# Patient Record
Sex: Female | Born: 1960 | Race: White | Hispanic: No | Marital: Married | Smoking: Never smoker
Health system: Southern US, Community
[De-identification: ages and names within clinical notes are randomized; demographics above are authoritative.]

## PROBLEM LIST (undated history)

## (undated) DIAGNOSIS — G2581 Restless legs syndrome: Secondary | ICD-10-CM

## (undated) DIAGNOSIS — E7800 Pure hypercholesterolemia, unspecified: Secondary | ICD-10-CM

## (undated) DIAGNOSIS — F419 Anxiety disorder, unspecified: Secondary | ICD-10-CM

## (undated) DIAGNOSIS — D649 Anemia, unspecified: Secondary | ICD-10-CM

## (undated) DIAGNOSIS — G4730 Sleep apnea, unspecified: Secondary | ICD-10-CM

## (undated) DIAGNOSIS — J45909 Unspecified asthma, uncomplicated: Secondary | ICD-10-CM

## (undated) DIAGNOSIS — G43909 Migraine, unspecified, not intractable, without status migrainosus: Secondary | ICD-10-CM

## (undated) DIAGNOSIS — K219 Gastro-esophageal reflux disease without esophagitis: Secondary | ICD-10-CM

## (undated) DIAGNOSIS — R251 Tremor, unspecified: Secondary | ICD-10-CM

## (undated) DIAGNOSIS — E079 Disorder of thyroid, unspecified: Secondary | ICD-10-CM

## (undated) DIAGNOSIS — G709 Myoneural disorder, unspecified: Secondary | ICD-10-CM

## (undated) DIAGNOSIS — N812 Incomplete uterovaginal prolapse: Secondary | ICD-10-CM

## (undated) DIAGNOSIS — E663 Overweight: Secondary | ICD-10-CM

## (undated) DIAGNOSIS — H9190 Unspecified hearing loss, unspecified ear: Secondary | ICD-10-CM

## (undated) DIAGNOSIS — F329 Major depressive disorder, single episode, unspecified: Secondary | ICD-10-CM

## (undated) DIAGNOSIS — F32A Depression, unspecified: Secondary | ICD-10-CM

## (undated) HISTORY — PX: WISDOM TOOTH EXTRACTION: SHX21

## (undated) HISTORY — DX: Overweight: E66.3

## (undated) HISTORY — DX: Unspecified hearing loss, unspecified ear: H91.90

## (undated) HISTORY — DX: Anxiety disorder, unspecified: F41.9

## (undated) HISTORY — DX: Sleep apnea, unspecified: G47.30

## (undated) HISTORY — DX: Migraine, unspecified, not intractable, without status migrainosus: G43.909

## (undated) HISTORY — PX: MOUTH SURGERY: SHX715

## (undated) HISTORY — DX: Anemia, unspecified: D64.9

## (undated) HISTORY — DX: Pure hypercholesterolemia, unspecified: E78.00

## (undated) HISTORY — DX: Restless legs syndrome: G25.81

## (undated) HISTORY — DX: Incomplete uterovaginal prolapse: N81.2

## (undated) HISTORY — PX: OTHER SURGICAL HISTORY: SHX169

## (undated) HISTORY — DX: Myoneural disorder, unspecified: G70.9

## (undated) HISTORY — PX: BREAST BIOPSY: SHX20

## (undated) HISTORY — DX: Gastro-esophageal reflux disease without esophagitis: K21.9

## (undated) HISTORY — DX: Depression, unspecified: F32.A

## (undated) HISTORY — PX: ROOT CANAL: SHX2363

## (undated) HISTORY — DX: Tremor, unspecified: R25.1

## (undated) HISTORY — DX: Disorder of thyroid, unspecified: E07.9

## (undated) HISTORY — DX: Major depressive disorder, single episode, unspecified: F32.9

## (undated) HISTORY — DX: Unspecified asthma, uncomplicated: J45.909

---

## 1990-02-24 DIAGNOSIS — Z5189 Encounter for other specified aftercare: Secondary | ICD-10-CM

## 1990-02-24 HISTORY — DX: Encounter for other specified aftercare: Z51.89

## 1990-02-24 HISTORY — PX: DILATION AND CURETTAGE OF UTERUS: SHX78

## 2011-05-06 DIAGNOSIS — N814 Uterovaginal prolapse, unspecified: Secondary | ICD-10-CM | POA: Insufficient documentation

## 2011-05-09 LAB — HM COLONOSCOPY

## 2011-10-21 DIAGNOSIS — R12 Heartburn: Secondary | ICD-10-CM | POA: Insufficient documentation

## 2012-04-29 DIAGNOSIS — Z9109 Other allergy status, other than to drugs and biological substances: Secondary | ICD-10-CM | POA: Insufficient documentation

## 2012-09-09 DIAGNOSIS — R251 Tremor, unspecified: Secondary | ICD-10-CM | POA: Insufficient documentation

## 2013-02-24 HISTORY — PX: COLONOSCOPY: SHX174

## 2013-07-29 DIAGNOSIS — H9190 Unspecified hearing loss, unspecified ear: Secondary | ICD-10-CM | POA: Insufficient documentation

## 2015-06-07 DIAGNOSIS — Z1231 Encounter for screening mammogram for malignant neoplasm of breast: Secondary | ICD-10-CM | POA: Diagnosis not present

## 2015-11-19 DIAGNOSIS — H903 Sensorineural hearing loss, bilateral: Secondary | ICD-10-CM | POA: Diagnosis not present

## 2016-01-04 DIAGNOSIS — L259 Unspecified contact dermatitis, unspecified cause: Secondary | ICD-10-CM | POA: Diagnosis not present

## 2016-01-29 DIAGNOSIS — G250 Essential tremor: Secondary | ICD-10-CM | POA: Diagnosis not present

## 2016-01-29 DIAGNOSIS — G4733 Obstructive sleep apnea (adult) (pediatric): Secondary | ICD-10-CM | POA: Diagnosis not present

## 2016-03-05 DIAGNOSIS — G4733 Obstructive sleep apnea (adult) (pediatric): Secondary | ICD-10-CM | POA: Diagnosis not present

## 2016-03-24 DIAGNOSIS — R251 Tremor, unspecified: Secondary | ICD-10-CM | POA: Diagnosis not present

## 2016-11-12 ENCOUNTER — Encounter: Payer: Self-pay | Admitting: Internal Medicine

## 2016-11-12 ENCOUNTER — Ambulatory Visit (INDEPENDENT_AMBULATORY_CARE_PROVIDER_SITE_OTHER): Payer: BLUE CROSS/BLUE SHIELD | Admitting: Internal Medicine

## 2016-11-12 VITALS — BP 120/80 | HR 65 | Temp 98.4°F | Resp 18 | Ht 63.39 in | Wt 143.2 lb

## 2016-11-12 DIAGNOSIS — G4730 Sleep apnea, unspecified: Secondary | ICD-10-CM | POA: Diagnosis not present

## 2016-11-12 DIAGNOSIS — F419 Anxiety disorder, unspecified: Secondary | ICD-10-CM | POA: Diagnosis not present

## 2016-11-12 DIAGNOSIS — D509 Iron deficiency anemia, unspecified: Secondary | ICD-10-CM | POA: Diagnosis not present

## 2016-11-12 DIAGNOSIS — K219 Gastro-esophageal reflux disease without esophagitis: Secondary | ICD-10-CM | POA: Diagnosis not present

## 2016-11-12 DIAGNOSIS — F329 Major depressive disorder, single episode, unspecified: Secondary | ICD-10-CM

## 2016-11-12 DIAGNOSIS — G250 Essential tremor: Secondary | ICD-10-CM

## 2016-11-12 DIAGNOSIS — E782 Mixed hyperlipidemia: Secondary | ICD-10-CM

## 2016-11-12 DIAGNOSIS — Z79899 Other long term (current) drug therapy: Secondary | ICD-10-CM | POA: Diagnosis not present

## 2016-11-12 DIAGNOSIS — F32A Anxiety disorder, unspecified: Secondary | ICD-10-CM

## 2016-11-12 LAB — COMPLETE METABOLIC PANEL WITH GFR
AG RATIO: 2.4 (calc) (ref 1.0–2.5)
ALT: 15 U/L (ref 6–29)
AST: 16 U/L (ref 10–35)
Albumin: 4.3 g/dL (ref 3.6–5.1)
Alkaline phosphatase (APISO): 88 U/L (ref 33–130)
BUN: 12 mg/dL (ref 7–25)
CALCIUM: 8.9 mg/dL (ref 8.6–10.4)
CO2: 28 mmol/L (ref 20–32)
Chloride: 107 mmol/L (ref 98–110)
Creat: 0.98 mg/dL (ref 0.50–1.05)
GFR, EST NON AFRICAN AMERICAN: 65 mL/min/{1.73_m2} (ref >=60)
GFR, Est African American: 75 mL/min/{1.73_m2} (ref >=60)
GLUCOSE: 89 mg/dL (ref 65–99)
Globulin: 1.8 g/dL (calc) — ABNORMAL LOW (ref 1.9–3.7)
POTASSIUM: 4.2 mmol/L (ref 3.5–5.3)
Sodium: 142 mmol/L (ref 135–146)
Total Bilirubin: 0.4 mg/dL (ref 0.2–1.2)
Total Protein: 6.1 g/dL (ref 6.1–8.1)

## 2016-11-12 LAB — CBC WITH DIFFERENTIAL/PLATELET
Basophils Absolute: 71 cells/uL (ref 0–200)
Basophils Relative: 1.4 %
EOS PCT: 4.1 %
Eosinophils Absolute: 209 cells/uL (ref 15–500)
HCT: 36.6 % (ref 35.0–45.0)
Hemoglobin: 12.6 g/dL (ref 11.7–15.5)
Lymphs Abs: 1586 cells/uL (ref 850–3900)
MCH: 31.6 pg (ref 27.0–33.0)
MCHC: 34.4 g/dL (ref 32.0–36.0)
MCV: 91.7 fL (ref 80.0–100.0)
MONOS PCT: 8.2 %
MPV: 11.2 fL (ref 7.5–12.5)
NEUTROS PCT: 55.2 %
Neutro Abs: 2815 cells/uL (ref 1500–7800)
PLATELETS: 157 10*3/uL (ref 140–400)
RBC: 3.99 10*6/uL (ref 3.80–5.10)
RDW: 12.3 % (ref 11.0–15.0)
TOTAL LYMPHOCYTE: 31.1 %
WBC mixed population: 418 cells/uL (ref 200–950)
WBC: 5.1 10*3/uL (ref 3.8–10.8)

## 2016-11-12 LAB — LIPID PANEL
Cholesterol: 216 mg/dL — ABNORMAL HIGH (ref <200)
HDL: 34 mg/dL — AB (ref >50)
LDL Cholesterol (Calc): 137 mg/dL (calc) — ABNORMAL HIGH
NON-HDL CHOLESTEROL (CALC): 182 mg/dL — AB (ref <130)
TRIGLYCERIDES: 314 mg/dL — AB (ref <150)
Total CHOL/HDL Ratio: 6.4 (calc) — ABNORMAL HIGH (ref <5.0)

## 2016-11-12 LAB — TSH: TSH: 4.44 mIU/L

## 2016-11-12 MED ORDER — DULOXETINE HCL 20 MG PO CPEP: 40.0000 mg | ORAL_CAPSULE | Freq: Every day | ORAL | 3 refills | Status: DC

## 2016-11-12 MED ORDER — ALBUTEROL SULFATE HFA 108 (90 BASE) MCG/ACT IN AERS: 1.0000 | INHALATION_SPRAY | Freq: Four times a day (QID) | RESPIRATORY_TRACT | 3 refills | Status: DC | PRN

## 2016-11-12 MED ORDER — PROPRANOLOL HCL 80 MG PO TABS: 80.0000 mg | ORAL_TABLET | Freq: Every day | ORAL | 3 refills | Status: DC

## 2016-11-12 MED ORDER — BUPROPION HCL ER (SR) 150 MG PO TB12: 150.0000 mg | ORAL_TABLET | Freq: Three times a day (TID) | ORAL | 3 refills | Status: DC

## 2016-11-12 MED ORDER — OMEPRAZOLE 20 MG PO CPDR: 20.0000 mg | DELAYED_RELEASE_CAPSULE | Freq: Every day | ORAL | 3 refills | Status: DC

## 2016-11-12 NOTE — Progress Notes (Signed)
Location:  [facility] clinic  Provider:  Dr. Eulas Post  Code Status:  Goals of Care:  Advanced Directives 11/12/2016  Does Patient Have a Medical Advance Directive? No  Would patient like information on creating a medical advance directive? Yes (ED - Information included in AVS)     Chief Complaint  Patient presents with  . Establish Care    HPI: Patient is a 56 y.o. female seen today to establish care. Patient moved to [place] from [place] with her husband. 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 Her husband Advertising account executive at [employer].   She states she had labs a year ago.  CPAP use for control of apnea. She states not issues with use of CPAP  Anxiety and depression - States use of Duloxetine and Bupropion helps control.  Tremor - Benign essential tremor controlled with propranolol.  Iron deficiency anemia - She states blood transfusion in 1992 after the birth of her son due to part of the placenta that was not removed.     Past Medical History:  Diagnosis Date  . Anxiety   . Cervix prolapsed into vagina    Uro-Gyne Exam 2011, 2014  . Depression   . GERD (gastroesophageal reflux disease)   . Hearing loss   . High cholesterol   . Overweight   . Sleep apnea   . Tremor     Past Surgical History:  Procedure Laterality Date  . DILATION AND CURETTAGE OF UTERUS  02/24/1990  . MOUTH SURGERY     3 teeth removed   . ROOT CANAL    . ROOT CANAL     2 times   . VAGINAL DELIVERY     1990  . VAGINAL DELIVERY     1986    Allergies  Allergen Reactions  . Bactrim [Sulfamethoxazole-Trimethoprim] Rash  . Sulfur Rash    Outpatient Encounter Prescriptions as of 11/12/2016  Medication Sig  . albuterol (PROVENTIL HFA;VENTOLIN HFA) 108 (90 Base) MCG/ACT inhaler Inhale into the lungs.  Marland Kitchen aspirin EC 81 MG tablet Take 81 mg by mouth daily.  Marland Kitchen buPROPion (WELLBUTRIN SR) 150 MG 12 hr tablet Take 1 tablet by mouth 3 (three) times daily.  . DULoxetine  (CYMBALTA) 20 MG capsule Take 40 mg by mouth daily.  . ferrous sulfate 325 (65 FE) MG tablet Take 325 mg by mouth. 3-4 nights weekly  . FIBER ADULT GUMMIES PO Take by mouth. Takes a couple times a week especially if iron causes constipation  . omeprazole (PRILOSEC) 20 MG capsule Take 20 mg by mouth daily.  . propranolol (INDERAL) 80 MG tablet Take 80 mg by mouth daily.   No facility-administered encounter medications on file as of 11/12/2016.     Review of Systems:  Review of Systems  Constitutional: Negative for activity change, appetite change, chills, diaphoresis, fatigue, fever and unexpected weight change.  HENT: Positive for hearing loss (Uses Hearing aids bilateral). Negative for congestion, dental problem, drooling, ear discharge, ear pain, facial swelling, mouth sores, nosebleeds, postnasal drip, rhinorrhea, sinus pain, sinus pressure, sneezing, sore throat, tinnitus, trouble swallowing and voice change.   Eyes: Negative for photophobia, pain, discharge, redness, itching and visual disturbance.  Respiratory: Negative for apnea, cough, choking, chest tightness, shortness of breath, wheezing and stridor.   Cardiovascular: Negative for chest pain, palpitations and leg swelling.  Gastrointestinal: Negative for abdominal distention, abdominal pain, anal bleeding, blood in stool, constipation, diarrhea, nausea, rectal pain and vomiting.  Endocrine: Negative for cold intolerance,  heat intolerance, polydipsia, polyphagia and polyuria.  Genitourinary: Negative for decreased urine volume, difficulty urinating, dyspareunia, dysuria, enuresis, flank pain, frequency, genital sores, hematuria, menstrual problem, pelvic pain, urgency, vaginal bleeding, vaginal discharge and vaginal pain.  Musculoskeletal: Negative for arthralgias, back pain, gait problem, joint swelling, myalgias, neck pain and neck stiffness.  Skin: Negative for color change, pallor, rash and wound.  Allergic/Immunologic: Negative for  environmental allergies, food allergies and immunocompromised state.  Neurological: Negative for dizziness, tremors, seizures, syncope, facial asymmetry, speech difficulty, weakness, light-headedness, numbness and headaches.  Hematological: Negative for adenopathy. Does not bruise/bleed easily.  Psychiatric/Behavioral: Negative for agitation, behavioral problems, confusion, decreased concentration, dysphoric mood, hallucinations, self-injury, sleep disturbance and suicidal ideas. The patient is not nervous/anxious and is not hyperactive.     Health Maintenance  Topic Date Due  . Hepatitis C Screening  07-10-1960  . HIV Screening  01/12/1976  . TETANUS/TDAP  01/12/1980  . PAP SMEAR  01/11/1982  . MAMMOGRAM  01/12/2011  . COLONOSCOPY  01/12/2011  . INFLUENZA VACCINE  09/24/2016    Physical Exam: Vitals:   11/12/16 0841  BP: 120/80  Pulse: 65  Resp: 18  Temp: 98.4 F (36.9 C)  TempSrc: Oral  SpO2: 96%  Weight: 143 lb 3.2 oz (65 kg)  Height: 5' 3.39" (1.61 m)   Body mass index is 25.06 kg/m. Physical Exam  Constitutional: She is oriented to person, place, and time. She appears well-developed and well-nourished. No distress.  HENT:  Head: Normocephalic and atraumatic.  Right Ear: External ear normal.  Left Ear: External ear normal.  Nose: Nose normal.  Mouth/Throat: Oropharynx is clear and moist.  Eyes: Pupils are equal, round, and reactive to light. Conjunctivae and EOM are normal. Right eye exhibits no discharge. Left eye exhibits no discharge. No scleral icterus.  Neck: Normal range of motion. Neck supple. No JVD present. No tracheal deviation present. No thyromegaly present.  Cardiovascular: Normal rate, regular rhythm, normal heart sounds and intact distal pulses.  Exam reveals no gallop and no friction rub.   No murmur heard. Pulmonary/Chest: Effort normal and breath sounds normal. No stridor. No respiratory distress. She has no wheezes. She has no rales. She exhibits no  tenderness.  Abdominal: Soft. Bowel sounds are normal. She exhibits no distension and no mass. There is no tenderness. There is no rebound and no guarding.  Musculoskeletal: Normal range of motion. She exhibits no edema, tenderness or deformity.  Lymphadenopathy:    She has no cervical adenopathy.  Neurological: She is alert and oriented to person, place, and time. She has normal reflexes.  Skin: Skin is warm and dry. No rash noted. She is not diaphoretic. No erythema. No pallor.  Psychiatric: She has a normal mood and affect. Her behavior is normal. Judgment and thought content normal.    Labs reviewed: Basic Metabolic Panel: No results for input(s): NA, K, CL, CO2, GLUCOSE, BUN, CREATININE, CALCIUM, MG, PHOS, TSH in the last 8760 hours. Liver Function Tests: No results for input(s): AST, ALT, ALKPHOS, BILITOT, PROT, ALBUMIN in the last 8760 hours. No results for input(s): LIPASE, AMYLASE in the last 8760 hours. No results for input(s): AMMONIA in the last 8760 hours. CBC: No results for input(s): WBC, NEUTROABS, HGB, HCT, MCV, PLT in the last 8760 hours. Lipid Panel: No results for input(s): CHOL, HDL, LDLCALC, TRIG, CHOLHDL, LDLDIRECT in the last 8760 hours. No results found for: HGBA1C  Procedures since last visit: No results found.  Assessment/Plan  Return in 3 months for  follow up of anxiety/depression   Labs/tests ordered:   Next appt:  Visit date not found   Marua Qin C. Dezirea Mccollister, Student AGACNP

## 2016-11-12 NOTE — Progress Notes (Signed)
Patient ID: Dominique [name], female   DOB: 07-12-1960, 56 y.o.   MRN: [id number]    Location:  [facility] Place of Service: OFFICE    Advanced Directive information Does Patient Have a Medical Advance Directive?: No, Would patient like information on creating a medical advance directive?: Yes (ED - Information included in AVS)  Chief Complaint  Patient presents with  . Establish Care    HPI:  56 yo female seen today as a new pt. She relocated from [place] last month. Last labs done > 1 yr ago. Last CPE in last yr. She needs med RF. No concerns today. She is a pediatrician with subspecialty in developmental d/o  Anxiety/depression - mood stable on bupropion SR 468m daily and cymbalta 480mdaily  GERD - stable on omeprazole daily  Tremor - benign essential. Stable on propanolol  Hx Fe deficiency anemia - she req'd blood transfusion in 1992 due to incomplete placental delivery. She has intermittent restless leg that improves when she takes iron tabs.  OSA - stable on CPAP qhs. Last sleep study about 4 yrs ago. She does not recall her settings. She uses HFA prn  Hx prolapsed cervix - needs GYN eval  Past Medical History:  Diagnosis Date  . Anxiety   . Cervix prolapsed into vagina    Uro-Gyne Exam 2011, 2014  . Depression   . GERD (gastroesophageal reflux disease)   . Hearing loss   . High cholesterol   . Overweight   . Sleep apnea   . Tremor     Past Surgical History:  Procedure Laterality Date  . DILATION AND CURETTAGE OF UTERUS  02/24/1990  . MOUTH SURGERY     3 teeth removed   . ROOT CANAL    . ROOT CANAL     2 times   . VAGINAL DELIVERY     1990  . VAGINAL DELIVERY     1986    Patient Care Team: CaGildardo CrankerDO as PCP - General (Internal Medicine)  Social History   Social History  . Marital status: Married    Spouse name: N/A  . Number of children: N/A  . Years of education: N/A   Occupational History  . Not on file.   Social History Main Topics  . Smoking  status: Never Smoker  . Smokeless tobacco: Never Used  . Alcohol use Yes     Comment: 4 drinks per week   . Drug use: No  . Sexual activity: Not on file   Other Topics Concern  . Not on file   Social History Narrative   As of 11/11/16   Diet: Minimal red meat, lactose intolerance       Caffeine: Yes      Married, if yes what year: Yes, 2006      Do you live in a house, apartment, assisted living, condo, trailer, ect: House, 2 stories, 2 persons      Pets: 1 dog      Current/Past profession: Pediatrician       Exercise: A little, walking dog and elliptical          Living Will: No   DNR: No   POA/HPOA: No      Functional Status:   Do you have difficulty bathing or dressing yourself? No   Do you have difficulty preparing food or eating? No   Do you have difficulty managing your medications? No    Do you have difficulty managing your finances? No  Do you have difficulty affording your medications? No         reports that she has never smoked. She has never used smokeless tobacco. She reports that she drinks alcohol. She reports that she does not use drugs.  Family History  Problem Relation Age of Onset  . Hypertension Mother   . High Cholesterol Mother   . Cataracts Mother   . Retinal detachment Mother   . Hypertension Brother   . High Cholesterol Brother   . Depression Daughter   . Menstrual problems Daughter   . ADD / ADHD Son   . Depression Son   . Heart disease Maternal Uncle   . Diabetes Paternal Aunt   . Diabetes Paternal Grandfather   . Arthritis Paternal Grandfather    Family Status  Relation Status  . Mother Alive  . Father Alive  . Sister Alive  . Brother Alive  . Daughter Alive  . Daughter Alive  . Son Alive  . Mat Uncle (Not Specified)  . Ethlyn Daniels (Not Specified)  . PGF (Not Specified)     There is no immunization history on file for this patient.  Allergies  Allergen Reactions  . Bactrim [Sulfamethoxazole-Trimethoprim] Rash  .  Sulfur Rash    Medications: Patient's Medications  New Prescriptions   No medications on file  Previous Medications   ALBUTEROL (PROVENTIL HFA;VENTOLIN HFA) 108 (90 BASE) MCG/ACT INHALER    Inhale into the lungs.   ASPIRIN EC 81 MG TABLET    Take 81 mg by mouth daily.   BUPROPION (WELLBUTRIN SR) 150 MG 12 HR TABLET    Take 1 tablet by mouth 3 (three) times daily.   DULOXETINE (CYMBALTA) 20 MG CAPSULE    Take 40 mg by mouth daily.   FERROUS SULFATE 325 (65 FE) MG TABLET    Take 325 mg by mouth. 3-4 nights weekly   FIBER ADULT GUMMIES PO    Take by mouth. Takes a couple times a week especially if iron causes constipation   OMEPRAZOLE (PRILOSEC) 20 MG CAPSULE    Take 20 mg by mouth daily.   PROPRANOLOL (INDERAL) 80 MG TABLET    Take 80 mg by mouth daily.  Modified Medications   No medications on file  Discontinued Medications   No medications on file    Review of Systems  HENT: Positive for dental problem (needs bridge/implant) and hearing loss (has hearing aids).        Dry mouth  Eyes: Positive for visual disturbance (wears glasses).  Gastrointestinal:       Flatulence; indigestion  Musculoskeletal: Positive for arthralgias.  Skin:       dry  Neurological: Positive for tremors.  Hematological:       Had blood transfusion in 1992 due to placental issue  Psychiatric/Behavioral: Positive for dysphoric mood. The patient is nervous/anxious.   All other systems reviewed and are negative. per new pt packet  Vitals:   11/12/16 0841  BP: 120/80  Pulse: 65  Resp: 18  Temp: 98.4 F (36.9 C)  TempSrc: Oral  SpO2: 96%  Weight: 143 lb 3.2 oz (65 kg)  Height: 5' 3.39" (1.61 m)   Body mass index is 25.06 kg/m.  Physical Exam  Constitutional: She is oriented to person, place, and time. She appears well-developed and well-nourished.  HENT:  Mouth/Throat: Oropharynx is clear and moist. No oropharyngeal exudate.  MMM; no oral thrush  Eyes: Pupils are equal, round, and reactive to  light. No scleral icterus.  Neck: Neck supple. Carotid bruit is not present. No tracheal deviation present. No thyromegaly present.  Cardiovascular: Normal rate, regular rhythm, normal heart sounds and intact distal pulses.  Exam reveals no gallop and no friction rub.   No murmur heard. No LE edema b/l. no calf TTP.   Pulmonary/Chest: Effort normal and breath sounds normal. No stridor. No respiratory distress. She has no wheezes. She has no rales.  Abdominal: Soft. Normal appearance and bowel sounds are normal. She exhibits no distension and no mass. There is no hepatomegaly. There is no tenderness. There is no rigidity, no rebound and no guarding. No hernia.  Lymphadenopathy:    She has no cervical adenopathy.  Neurological: She is alert and oriented to person, place, and time. She displays tremor (resting fine).  Skin: Skin is warm and dry. No rash noted.  Psychiatric: She has a normal mood and affect. Her behavior is normal. Judgment and thought content normal.     Labs reviewed: No results found for any previous visit.    No results found.   Assessment/Plan   ICD-10-CM   1. Anxiety and depression F41.9 buPROPion (WELLBUTRIN SR) 150 MG 12 hr tablet   F32.9 DULoxetine (CYMBALTA) 20 MG capsule    CBC with Differential/Platelets  2. Benign essential tremor G25.0 propranolol (INDERAL) 80 MG tablet    CBC with Differential/Platelets  3. Mixed hyperlipidemia E78.2 CBC with Differential/Platelets    Lipid Panel    TSH  4. Gastroesophageal reflux disease without esophagitis K21.9 omeprazole (PRILOSEC) 20 MG capsule    CBC with Differential/Platelets  5. Iron deficiency anemia, unspecified iron deficiency anemia type D50.9 CBC with Differential/Platelets  6. Sleep apnea, unspecified type G47.30 CBC with Differential/Platelets  7. High risk medication use Z79.899 CMP with eGFR   Continue current medications as ordered  Get old records  will call with lab results  Recommend you  get annual flu shot  Recommend you make GYN appt with local provider Sadie Haber, [place], etc)  Follow up in 3 mos for anxiety/depression   Dereck Agerton S. Perlie Gold  [facility] [address] [place], [place] [address] [phone] Cell (Monday-Friday 8 AM - 5 PM) [phone] After 5 PM and follow prompts

## 2016-11-12 NOTE — Patient Instructions (Signed)
Continue current medications as ordered  will call with lab results  Recommend you get annual flu shot  Recommend you make GYN appt with local provider Dominique [name], [facility], etc)  Follow up in 3 mos for anxiety/depression

## 2017-01-12 ENCOUNTER — Ambulatory Visit: Payer: BLUE CROSS/BLUE SHIELD | Admitting: Nurse Practitioner

## 2017-01-12 ENCOUNTER — Encounter: Payer: Self-pay | Admitting: Nurse Practitioner

## 2017-01-12 VITALS — BP 122/76 | HR 65 | Temp 98.2°F | Resp 17 | Ht 63.0 in | Wt 145.0 lb

## 2017-01-12 DIAGNOSIS — J019 Acute sinusitis, unspecified: Secondary | ICD-10-CM | POA: Diagnosis not present

## 2017-01-12 DIAGNOSIS — Z20828 Contact with and (suspected) exposure to other viral communicable diseases: Secondary | ICD-10-CM

## 2017-01-12 LAB — POCT INFLUENZA A/B
Influenza A, POC: NEGATIVE
Influenza B, POC: NEGATIVE

## 2017-01-12 MED ORDER — BENZONATATE 100 MG PO CAPS
100.0000 mg | ORAL_CAPSULE | Freq: Two times a day (BID) | ORAL | 0 refills | Status: DC | PRN
Start: 2017-01-12 — End: 2017-02-11

## 2017-01-12 NOTE — Progress Notes (Signed)
Careteam: Patient Care Team: Dominique Cranker, DO as PCP - General (Internal Medicine)  Advanced Directive information Does Patient Have a Medical Advance Directive?: No, Would patient like information on creating a medical advance directive?: No - Patient declined  Allergies  Allergen Reactions  . Bactrim [Sulfamethoxazole-Trimethoprim] Rash  . Sulfur Rash    Chief Complaint  Patient presents with  . Acute Visit    Pt is being seen for possible flu. Pt was exposed to co-worker 4 to 5 days ago that was positive for flu.      HPI: Patient is a 56 y.o. female seen in the office today for possible flu.  Influenza swab negative.  Sore throat started 3 days ago and since then has had increase congestion in her head/face and can hear it when she talks.  Chills yesterday. No fever that she is aware of.  Declines body aches.  Occasional cough, non-production.  Nose is draining and cough at night.  Using dayquil which helps.    Review of Systems:  Review of Systems  Constitutional: Positive for chills. Negative for fever and malaise/fatigue.  HENT: Positive for congestion and sore throat. Negative for ear discharge and ear pain.   Respiratory: Positive for cough. Negative for sputum production, shortness of breath and wheezing.   Cardiovascular: Negative for chest pain.  Neurological: Negative for dizziness, weakness and headaches.    Past Medical History:  Diagnosis Date  . Anxiety   . Asthma   . Cervix prolapsed into vagina    Uro-Gyne Exam 2011, 2014  . Depression   . GERD (gastroesophageal reflux disease)   . Hearing loss   . High cholesterol   . Overweight   . Sleep apnea   . Tremor    Past Surgical History:  Procedure Laterality Date  . DILATION AND CURETTAGE OF UTERUS  02/24/1990  . MOUTH SURGERY     3 teeth removed   . ROOT CANAL    . ROOT CANAL     2 times   . VAGINAL DELIVERY     1990  . VAGINAL DELIVERY     1986   Social History:   reports that   has never smoked. she has never used smokeless tobacco. She reports that she drinks alcohol. She reports that she does not use drugs.  Family History  Problem Relation Age of Onset  . Hypertension Mother   . High Cholesterol Mother   . Cataracts Mother   . Retinal detachment Mother   . Hypertension Brother   . High Cholesterol Brother   . Depression Daughter   . Menstrual problems Daughter   . ADD / ADHD Son   . Depression Son   . Heart disease Maternal Uncle   . Diabetes Paternal Aunt   . Diabetes Paternal Grandfather   . Arthritis Paternal Grandfather     Medications:   Medication List        Accurate as of 01/12/17 10:01 AM. Always use your most recent med list.          albuterol 108 (90 Base) MCG/ACT inhaler Commonly known as:  PROVENTIL HFA;VENTOLIN HFA Inhale 1 puff into the lungs every 6 (six) hours as needed for wheezing or shortness of breath.   aspirin EC 81 MG tablet   buPROPion 150 MG 12 hr tablet Commonly known as:  WELLBUTRIN SR Take 1 tablet (150 mg total) by mouth 3 (three) times daily.   DULoxetine 20 MG capsule Commonly known as:  CYMBALTA Take 2 capsules (40 mg total) by mouth daily.   ferrous sulfate 325 (65 FE) MG tablet   FIBER ADULT GUMMIES PO   omeprazole 20 MG capsule Commonly known as:  PRILOSEC Take 1 capsule (20 mg total) by mouth daily.   propranolol 80 MG tablet Commonly known as:  INDERAL Take 1 tablet (80 mg total) by mouth daily.        Physical Exam:  Vitals:   01/12/17 0955  BP: 122/76  Pulse: 65  Resp: 17  Temp: 98.2 F (36.8 C)  TempSrc: Oral  SpO2: 98%  Weight: 145 lb (65.8 kg)  Height: 5\' 3"  (1.6 m)   Body mass index is 25.69 kg/m.  Physical Exam  Constitutional: She is oriented to person, place, and time. She appears well-developed and well-nourished. No distress.  HENT:  Head: Normocephalic and atraumatic.  Nose: Mucosal edema, rhinorrhea and sinus tenderness present.  Mouth/Throat: Posterior  oropharyngeal erythema (no exudate ) present. No oropharyngeal exudate.  Eyes: Conjunctivae are normal. Pupils are equal, round, and reactive to light.  Neck: Normal range of motion. Neck supple.  Cardiovascular: Normal rate, regular rhythm and normal heart sounds.  Pulmonary/Chest: Effort normal and breath sounds normal.  Musculoskeletal: She exhibits no edema or tenderness.  Neurological: She is alert and oriented to person, place, and time. No cranial nerve deficit.  Skin: Skin is warm and dry. She is not diaphoretic.  Psychiatric: She has a normal mood and affect.    Labs reviewed: Basic Metabolic Panel: Recent Labs    11/12/16 1008  NA 142  K 4.2  CL 107  CO2 28  GLUCOSE 89  BUN 12  CREATININE 0.98  CALCIUM 8.9  TSH 4.44   Liver Function Tests: Recent Labs    11/12/16 1008  AST 16  ALT 15  BILITOT 0.4  PROT 6.1   No results for input(s): LIPASE, AMYLASE in the last 8760 hours. No results for input(s): AMMONIA in the last 8760 hours. CBC: Recent Labs    11/12/16 1008  WBC 5.1  NEUTROABS 2,815  HGB 12.6  HCT 36.6  MCV 91.7  PLT 157   Lipid Panel: Recent Labs    11/12/16 1008  CHOL 216*  HDL 34*  TRIG 314*  CHOLHDL 6.4*   TSH: Recent Labs    11/12/16 1008  TSH 4.44   A1C: No results found for: HGBA1C   Assessment/Plan 1. Exposure to the flu -co-worker was positive for influenza last week - POC Influenza A/B- negative  2. Acute non-recurrent sinusitis, unspecified location -viral, supportive care encouraged.  neti pot twice daily Plain nasal saline spray throughout the day as needed May use tylenol 325 mg 2 tablets every 6 hours as needed aches and pains or sore throat humidifier in the home to help with the dry air Mucinex DM by mouth twice daily as needed for cough and congestion with full glass of water  Keep well hydrated Avoid forcefully blowing nose -stay hydrated - benzonatate (TESSALON) 100 MG capsule; Take 1 capsule (100 mg  total) 2 (two) times daily as needed by mouth for cough.  Dispense: 20 capsule; Refill: 0  Return precautions discussed.  Carlos American. Harle Battiest  [facility] [phone] 8 am - 5 pm) [phone] (after hours)

## 2017-01-12 NOTE — Patient Instructions (Addendum)
neti pot twice daily Plain nasal saline spray throughout the day as needed May use tylenol 325 mg 2 tablets every 6 hours as needed aches and pains or sore throat humidifier in the home to help with the dry air Mucinex DM by mouth twice daily as needed for cough and congestion with full glass of water  Keep well hydrated To use benzonatate 100 mg by mouth every 8 hours as needed cough Avoid forcefully blowing nose   Sinusitis, Adult Sinusitis is soreness and inflammation of your sinuses. Sinuses are hollow spaces in the bones around your face. Your sinuses are located:  Around your eyes.  In the middle of your forehead.  Behind your nose.  In your cheekbones.  Your sinuses and nasal passages are lined with a stringy fluid (mucus). Mucus normally drains out of your sinuses. When your nasal tissues become inflamed or swollen, the mucus can become trapped or blocked so air cannot flow through your sinuses. This allows bacteria, viruses, and funguses to grow, which leads to infection. Sinusitis can develop quickly and last for 7?10 days (acute) or for more than 12 weeks (chronic). Sinusitis often develops after a cold. What are the causes? This condition is caused by anything that creates swelling in the sinuses or stops mucus from draining, including:  Allergies.  Asthma.  Bacterial or viral infection.  Abnormally shaped bones between the nasal passages.  Nasal growths that contain mucus (nasal polyps).  Narrow sinus openings.  Pollutants, such as chemicals or irritants in the air.  A foreign object stuck in the nose.  A fungal infection. This is rare.  What increases the risk? The following factors may make you more likely to develop this condition:  Having allergies or asthma.  Having had a recent cold or respiratory tract infection.  Having structural deformities or blockages in your nose or sinuses.  Having a weak immune system.  Doing a lot of swimming or  diving.  Overusing nasal sprays.  Smoking.  What are the signs or symptoms? The main symptoms of this condition are pain and a feeling of pressure around the affected sinuses. Other symptoms include:  Upper toothache.  Earache.  Headache.  Bad breath.  Decreased sense of smell and taste.  A cough that may get worse at night.  Fatigue.  Fever.  Thick drainage from your nose. The drainage is often green and it may contain pus (purulent).  Stuffy nose or congestion.  Postnasal drip. This is when extra mucus collects in the throat or back of the nose.  Swelling and warmth over the affected sinuses.  Sore throat.  Sensitivity to light.  How is this diagnosed? This condition is diagnosed based on symptoms, a medical history, and a physical exam. To find out if your condition is acute or chronic, your health care provider may:  Look in your nose for signs of nasal polyps.  Tap over the affected sinus to check for signs of infection.  View the inside of your sinuses using an imaging device that has a light attached (endoscope).  If your health care provider suspects that you have chronic sinusitis, you may also:  Be tested for allergies.  Have a sample of mucus taken from your nose (nasal culture) and checked for bacteria.  Have a mucus sample examined to see if your sinusitis is related to an allergy.  If your sinusitis does not respond to treatment and it lasts longer than 8 weeks, you may have an MRI or CT  scan to check your sinuses. These scans also help to determine how severe your infection is. In rare cases, a bone biopsy may be done to rule out more serious types of fungal sinus disease. How is this treated? Treatment for sinusitis depends on the cause and whether your condition is chronic or acute. If a virus is causing your sinusitis, your symptoms will go away on their own within 10 days. You may be given medicines to relieve your symptoms,  including:  Topical nasal decongestants. They shrink swollen nasal passages and let mucus drain from your sinuses.  Antihistamines. These drugs block inflammation that is triggered by allergies. This can help to ease swelling in your nose and sinuses.  Topical nasal corticosteroids. These are nasal sprays that ease inflammation and swelling in your nose and sinuses.  Nasal saline washes. These rinses can help to get rid of thick mucus in your nose.  If your condition is caused by bacteria, you will be given an antibiotic medicine. If your condition is caused by a fungus, you will be given an antifungal medicine. Surgery may be needed to correct underlying conditions, such as narrow nasal passages. Surgery may also be needed to remove polyps. Follow these instructions at home: Medicines  Take, use, or apply over-the-counter and prescription medicines only as told by your health care provider. These may include nasal sprays.  If you were prescribed an antibiotic medicine, take it as told by your health care provider. Do not stop taking the antibiotic even if you start to feel better. Hydrate and Humidify  Drink enough water to keep your urine clear or pale yellow. Staying hydrated will help to thin your mucus.  Use a cool mist humidifier to keep the humidity level in your home above 50%.  Inhale steam for 10-15 minutes, 3-4 times a day or as told by your health care provider. You can do this in the bathroom while a hot shower is running.  Limit your exposure to cool or dry air. Rest  Rest as much as possible.  Sleep with your head raised (elevated).  Make sure to get enough sleep each night. General instructions  Apply a warm, moist washcloth to your face 3-4 times a day or as told by your health care provider. This will help with discomfort.  Wash your hands often with soap and water to reduce your exposure to viruses and other germs. If soap and water are not available, use hand  sanitizer.  Do not smoke. Avoid being around people who are smoking (secondhand smoke).  Keep all follow-up visits as told by your health care provider. This is important. Contact a health care provider if:  You have a fever.  Your symptoms get worse.  Your symptoms do not improve within 10 days. Get help right away if:  You have a severe headache.  You have persistent vomiting.  You have pain or swelling around your face or eyes.  You have vision problems.  You develop confusion.  Your neck is stiff.  You have trouble breathing. This information is not intended to replace advice given to you by your health care provider. Make sure you discuss any questions you have with your health care provider. Document Released: 02/10/2005 Document Revised: 10/07/2015 Document Reviewed: 12/06/2014 Elsevier Interactive Patient Education  2017 [employer].

## 2017-01-14 ENCOUNTER — Other Ambulatory Visit: Payer: Self-pay | Admitting: *Deleted

## 2017-01-14 DIAGNOSIS — F329 Major depressive disorder, single episode, unspecified: Secondary | ICD-10-CM

## 2017-01-14 DIAGNOSIS — F419 Anxiety disorder, unspecified: Principal | ICD-10-CM

## 2017-01-14 DIAGNOSIS — F32A Anxiety disorder, unspecified: Secondary | ICD-10-CM

## 2017-01-14 MED ORDER — DULOXETINE HCL 20 MG PO CPEP: 40.0000 mg | ORAL_CAPSULE | Freq: Every day | ORAL | 1 refills | Status: DC

## 2017-01-14 MED ORDER — BUPROPION HCL ER (SR) 150 MG PO TB12: 150.0000 mg | ORAL_TABLET | Freq: Three times a day (TID) | ORAL | 1 refills | Status: DC

## 2017-01-14 NOTE — Telephone Encounter (Signed)
[facility]

## 2017-01-26 ENCOUNTER — Other Ambulatory Visit: Payer: Self-pay | Admitting: *Deleted

## 2017-01-26 DIAGNOSIS — F329 Major depressive disorder, single episode, unspecified: Secondary | ICD-10-CM

## 2017-01-26 DIAGNOSIS — F419 Anxiety disorder, unspecified: Principal | ICD-10-CM

## 2017-01-26 DIAGNOSIS — F32A Anxiety disorder, unspecified: Secondary | ICD-10-CM

## 2017-01-26 MED ORDER — DULOXETINE HCL 20 MG PO CPEP: 40.0000 mg | ORAL_CAPSULE | Freq: Every day | ORAL | 1 refills | Status: DC

## 2017-01-26 MED ORDER — BUPROPION HCL ER (SR) 150 MG PO TB12: 150.0000 mg | ORAL_TABLET | Freq: Three times a day (TID) | ORAL | 1 refills | Status: DC

## 2017-01-26 NOTE — Telephone Encounter (Signed)
[facility]

## 2017-02-11 ENCOUNTER — Ambulatory Visit (INDEPENDENT_AMBULATORY_CARE_PROVIDER_SITE_OTHER): Payer: BLUE CROSS/BLUE SHIELD | Admitting: Internal Medicine

## 2017-02-11 ENCOUNTER — Encounter: Payer: Self-pay | Admitting: Internal Medicine

## 2017-02-11 VITALS — BP 118/80 | HR 67 | Temp 98.0°F | Ht 63.0 in | Wt 142.6 lb

## 2017-02-11 DIAGNOSIS — H903 Sensorineural hearing loss, bilateral: Secondary | ICD-10-CM | POA: Diagnosis not present

## 2017-02-11 DIAGNOSIS — G250 Essential tremor: Secondary | ICD-10-CM | POA: Diagnosis not present

## 2017-02-11 DIAGNOSIS — E782 Mixed hyperlipidemia: Secondary | ICD-10-CM

## 2017-02-11 DIAGNOSIS — F419 Anxiety disorder, unspecified: Secondary | ICD-10-CM | POA: Diagnosis not present

## 2017-02-11 DIAGNOSIS — L710 Perioral dermatitis: Secondary | ICD-10-CM | POA: Diagnosis not present

## 2017-02-11 DIAGNOSIS — F329 Major depressive disorder, single episode, unspecified: Secondary | ICD-10-CM | POA: Diagnosis not present

## 2017-02-11 DIAGNOSIS — F32A Anxiety disorder, unspecified: Secondary | ICD-10-CM

## 2017-02-11 NOTE — Patient Instructions (Signed)
Continue current medications as ordered  PLEASE CALL WITH NAME AND ADDRESS OF MAIL ORDER PHARMACY  Use topical cream to rash on mouth  Follow up with specialists as scheduled  Follow up in 3 mos fasting CPE/ECG.

## 2017-02-11 NOTE — Progress Notes (Signed)
Patient ID: Dominique [name], female   DOB: 08-25-1960, 56 y.o.   MRN: [id number]   Location:  [facility] OFFICE  Provider: DR Arletha Grippe  Goals of Care:  Advanced Directives 01/12/2017  Does Patient Have a Medical Advance Directive? No  Would patient like information on creating a medical advance directive? No - Patient declined     Chief Complaint  Patient presents with  . Medical Management of Chronic Issues    3 Months for anxiety and depression    HPI: Patient is a 56 y.o. female seen today for medical management of chronic diseases. Her right hearing aid broke and she needs another one.   Anxiety/depression - mood stable on bupropion SR 450mg  daily and cymbalta 40mg  daily. She had an emotional set back about 1 month ago but sx's resolved . 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 No SI/HI  GERD - controlled on omeprazole daily  Tremor - benign essential. controlled on propanolol  Hx Fe deficiency anemia - she req'd blood transfusion in 1992 due to incomplete placental delivery. She has intermittent restless leg that improves when she takes iron tabs. Hgb 12.6  OSA - controlled on CPAP qhs. Last sleep study about 4 yrs ago. She does not recall her settings. She uses HFA prn  Hx prolapsed cervix - needs GYN eval. No changes  Past Medical History:  Diagnosis Date  . Anxiety   . Asthma   . Cervix prolapsed into vagina    Uro-Gyne Exam 2011, 2014  . Depression   . GERD (gastroesophageal reflux disease)   . Hearing loss   . High cholesterol   . Overweight   . Sleep apnea   . Tremor     Past Surgical History:  Procedure Laterality Date  . DILATION AND CURETTAGE OF UTERUS  02/24/1990  . MOUTH SURGERY     3 teeth removed   . ROOT CANAL    . ROOT CANAL     2 times   . VAGINAL DELIVERY     1990  . VAGINAL DELIVERY     1986     reports that  has never smoked. she has never used smokeless tobacco. She reports that she drinks alcohol. She reports  that she does not use drugs. Social History   Socioeconomic History  . Marital status: Married    Spouse name: Not on file  . Number of children: Not on file  . Years of education: Not on file  . Highest education level: Not on file  Social Needs  . Financial resource strain: Not on file  . Food insecurity - worry: Not on file  . Food insecurity - inability: Not on file  . Transportation needs - medical: Not on file  . Transportation needs - non-medical: Not on file  Occupational History  . Not on file  Tobacco Use  . Smoking status: Never Smoker  . Smokeless tobacco: Never Used  Substance and Sexual Activity  . Alcohol use: Yes    Comment: 4 drinks per week   . Drug use: No  . Sexual activity: Not Currently  Other Topics Concern  . Not on file  Social History Narrative   As of 11/11/16   Diet: Minimal red meat, lactose intolerance       Caffeine: Yes      Married, if yes what year: Yes, 2006      Do you live in a house, apartment, assisted living,  condo, trailer, ect: House, 2 stories, 2 persons      Pets: 1 dog      Current/Past profession: Pediatrician       Exercise: A little, walking dog and elliptical          Living Will: No   DNR: No   POA/HPOA: No      Functional Status:   Do you have difficulty bathing or dressing yourself? No   Do you have difficulty preparing food or eating? No   Do you have difficulty managing your medications? No    Do you have difficulty managing your finances? No    Do you have difficulty affording your medications? No     Family History  Problem Relation Age of Onset  . Hypertension Mother   . High Cholesterol Mother   . Cataracts Mother   . Retinal detachment Mother   . Hypertension Brother   . High Cholesterol Brother   . Depression Daughter   . Menstrual problems Daughter   . ADD / ADHD Son   . Depression Son   . Heart disease Maternal Uncle   . Diabetes Paternal Aunt   . Diabetes Paternal Grandfather   .  Arthritis Paternal Grandfather     Allergies  Allergen Reactions  . Bactrim [Sulfamethoxazole-Trimethoprim] Rash  . Sulfur Rash    Outpatient Encounter Medications as of 02/11/2017  Medication Sig  . albuterol (PROVENTIL HFA;VENTOLIN HFA) 108 (90 Base) MCG/ACT inhaler Inhale 1 puff into the lungs every 6 (six) hours as needed for wheezing or shortness of breath.  Marland Kitchen aspirin EC 81 MG tablet Take 81 mg by mouth daily.  Marland Kitchen buPROPion (WELLBUTRIN SR) 150 MG 12 hr tablet Take 1 tablet (150 mg total) by mouth 3 (three) times daily.  . DULoxetine (CYMBALTA) 20 MG capsule Take 2 capsules (40 mg total) by mouth daily.  . ferrous sulfate 325 (65 FE) MG tablet Take 325 mg by mouth. 3-4 nights weekly  . FIBER ADULT GUMMIES PO Take by mouth. Takes half dose daily for constipation  . omeprazole (PRILOSEC) 20 MG capsule Take 1 capsule (20 mg total) by mouth daily.  . propranolol (INDERAL) 80 MG tablet Take 1 tablet (80 mg total) by mouth daily.  . [DISCONTINUED] benzonatate (TESSALON) 100 MG capsule Take 1 capsule (100 mg total) 2 (two) times daily as needed by mouth for cough. (Patient not taking: Reported on 02/11/2017)   No facility-administered encounter medications on file as of 02/11/2017.     Review of Systems:  Review of Systems  Health Maintenance  Topic Date Due  . Hepatitis C Screening  1961-01-21  . HIV Screening  01/12/1976  . TETANUS/TDAP  01/12/1980  . PAP SMEAR  01/11/1982  . MAMMOGRAM  01/12/2011  . COLONOSCOPY  05/08/2021  . INFLUENZA VACCINE  Completed    Physical Exam: Vitals:   02/11/17 0830  BP: 118/80  Pulse: 67  Temp: 98 F (36.7 C)  TempSrc: Other (Comment)  SpO2: 96%  Weight: 142 lb 9.6 oz (64.7 kg)  Height: 5\' 3"  (1.6 m)   Body mass index is 25.26 kg/m. Physical Exam  Constitutional: She is oriented to person, place, and time. She appears well-developed and well-nourished.  HENT:  Mouth/Throat: Oropharynx is clear and moist. No oropharyngeal exudate.    MMM; no oral thrush  Eyes: Pupils are equal, round, and reactive to light. No scleral icterus.  Neck: Neck supple. Carotid bruit is not present. No tracheal deviation present. No thyromegaly present.  Cardiovascular: Normal rate, regular rhythm, normal heart sounds and intact distal pulses. Exam reveals no gallop and no friction rub.  No murmur heard. No LE edema b/l. no calf TTP.   Pulmonary/Chest: Effort normal and breath sounds normal. No stridor. No respiratory distress. She has no wheezes. She has no rales.  Abdominal: Soft. Normal appearance and bowel sounds are normal. She exhibits no distension and no mass. There is no hepatomegaly. There is no tenderness. There is no rigidity, no rebound and no guarding. No hernia.  Lymphadenopathy:    She has no cervical adenopathy.  Neurological: She is alert and oriented to person, place, and time.  Skin: Skin is warm and dry. Rash noted.     Psychiatric: She has a normal mood and affect. Her behavior is normal. Judgment and thought content normal.    Labs reviewed: Basic Metabolic Panel: Recent Labs    11/12/16 1008  NA 142  K 4.2  CL 107  CO2 28  GLUCOSE 89  BUN 12  CREATININE 0.98  CALCIUM 8.9  TSH 4.44   Liver Function Tests: Recent Labs    11/12/16 1008  AST 16  ALT 15  BILITOT 0.4  PROT 6.1   No results for input(s): LIPASE, AMYLASE in the last 8760 hours. No results for input(s): AMMONIA in the last 8760 hours. CBC: Recent Labs    11/12/16 1008  WBC 5.1  NEUTROABS 2,815  HGB 12.6  HCT 36.6  MCV 91.7  PLT 157   Lipid Panel: Recent Labs    11/12/16 1008  CHOL 216*  HDL 34*  TRIG 314*  CHOLHDL 6.4*   No results found for: HGBA1C  Procedures since last visit: No results found.  Assessment/Plan   ICD-10-CM   1. 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 Perioral dermatitis L71.0   3. Anxiety and depression F41.9    F32.9    4. Benign essential tremor G25.0   5. Mixed hyperlipidemia E78.2    Continue current medications as ordered  PLEASE CALL WITH NAME AND ADDRESS OF MAIL ORDER PHARMACY  Use topical cream to rash on mouth  Follow up with specialists as scheduled  Follow up in 3 mos fasting CPE/ECG.  Will need CMP and lipid panel on that day  [facility] S. Perlie Gold  [facility] [address] [place],  [address] [phone] Cell (Monday-Friday 8 AM - 5 PM) [phone] After 5 PM and follow prompts

## 2017-05-13 ENCOUNTER — Encounter: Payer: PRIVATE HEALTH INSURANCE | Admitting: Internal Medicine

## 2017-06-17 ENCOUNTER — Encounter: Payer: BLUE CROSS/BLUE SHIELD | Admitting: Internal Medicine

## 2017-09-07 ENCOUNTER — Other Ambulatory Visit: Payer: BLUE CROSS/BLUE SHIELD

## 2017-09-09 ENCOUNTER — Ambulatory Visit (INDEPENDENT_AMBULATORY_CARE_PROVIDER_SITE_OTHER): Payer: BLUE CROSS/BLUE SHIELD | Admitting: Internal Medicine

## 2017-09-09 ENCOUNTER — Encounter: Payer: Self-pay | Admitting: Internal Medicine

## 2017-09-09 VITALS — BP 130/88 | HR 59 | Temp 97.7°F | Resp 18 | Ht 63.0 in | Wt 145.4 lb

## 2017-09-09 DIAGNOSIS — F419 Anxiety disorder, unspecified: Secondary | ICD-10-CM

## 2017-09-09 DIAGNOSIS — Z79899 Other long term (current) drug therapy: Secondary | ICD-10-CM

## 2017-09-09 DIAGNOSIS — F32A Anxiety disorder, unspecified: Secondary | ICD-10-CM

## 2017-09-09 DIAGNOSIS — E782 Mixed hyperlipidemia: Secondary | ICD-10-CM | POA: Diagnosis not present

## 2017-09-09 DIAGNOSIS — Z0000 Encounter for general adult medical examination without abnormal findings: Secondary | ICD-10-CM | POA: Diagnosis not present

## 2017-09-09 DIAGNOSIS — R9431 Abnormal electrocardiogram [ECG] [EKG]: Secondary | ICD-10-CM

## 2017-09-09 DIAGNOSIS — R1030 Lower abdominal pain, unspecified: Secondary | ICD-10-CM

## 2017-09-09 DIAGNOSIS — F329 Major depressive disorder, single episode, unspecified: Secondary | ICD-10-CM

## 2017-09-09 DIAGNOSIS — G250 Essential tremor: Secondary | ICD-10-CM | POA: Insufficient documentation

## 2017-09-09 NOTE — Patient Instructions (Addendum)
Continue current medications as ordered  Will call with lab results  Will call with 2D echocardiogram results  Follow up with specialists as scheduled  Follow up in 6 mos with [facility] for anxiety/depression, BET, OSA.  Keeping You Healthy  Get These Tests  Blood Pressure- Have your blood pressure checked by your healthcare provider at least once a year.  Normal blood pressure is 120/80.  Weight- Have your body mass index (BMI) calculated to screen for obesity.  BMI is a measure of body fat based on height and weight.  You can calculate your own BMI at [employer].it  Cholesterol- Have your cholesterol checked every year.  Diabetes- Have your blood sugar checked every year if you have high blood pressure, high cholesterol, a family history of diabetes or if you are overweight.  Pap Test - Have a pap test every 1 to 5 years if you have been sexually active.  If you are older than 65 and recent pap tests have been normal you may not need additional pap tests.  In addition, if you have had a hysterectomy  for benign disease additional pap tests are not necessary.  Mammogram-Yearly mammograms are essential for early detection of breast cancer  Screening for Colon Cancer- Colonoscopy starting at age 56. Screening may begin sooner depending on your family history and other health conditions.  Follow up colonoscopy as directed by your Gastroenterologist.  Screening for Osteoporosis- Screening begins at age 95 with bone density scanning, sooner if you are at higher risk for developing Osteoporosis.  Get these medicines  Calcium with Vitamin D- Your body requires 1200-1500 mg of Calcium a day and [phone] IU of Vitamin D a day.  You can only absorb 500 mg of Calcium at a time therefore Calcium must be taken in 2 or 3 separate doses throughout the day.  Hormones- Hormone therapy has been associated with increased risk for certain cancers and heart disease.  Talk to your healthcare  provider about if you need relief from menopausal symptoms.  Aspirin- Ask your healthcare provider about taking Aspirin to prevent Heart Disease and Stroke.  Get these Immuniztions  Flu shot- Every fall  Pneumonia shot- Once after the age of 65; if you are younger ask your healthcare provider if you need a pneumonia shot.  Tetanus- Every ten years.  Zostavax- Once after the age of 42 to prevent shingles.  Take these steps  Don't smoke- Your healthcare provider can help you quit. For tips on how to quit, ask your healthcare provider or go to www.smokefree.gov or call 1-800 QUIT-NOW.  Be physically active- Exercise 5 days a week for a minimum of 30 minutes.  If you are not already physically active, start slow and gradually work up to 30 minutes of moderate physical activity.  Try walking, dancing, bike riding, swimming, etc.  Eat a healthy diet- Eat a variety of healthy foods such as fruits, vegetables, whole grains, low fat milk, low fat cheeses, yogurt, lean meats, chicken, fish, eggs, dried beans, tofu, etc.  For more information go to www.thenutritionsource.org  Dental visit- Brush and floss teeth twice daily; visit your dentist twice a year.  Eye exam- Visit your Optometrist or Ophthalmologist yearly.  Drink alcohol in moderation- Limit alcohol intake to one drink or less a day.  Never drink and drive.  Depression- Your emotional health is as important as your physical health.  If you're feeling down or losing interest in things you normally enjoy, please talk to your healthcare provider.  Seat Belts- can save your life; always wear one  Smoke/Carbon Monoxide detectors- These detectors need to be installed on the appropriate level of your home.  Replace batteries at least once a year.  Violence- If anyone is threatening or hurting you, please tell your healthcare provider.  Living Will/ Health care power of attorney- Discuss with your healthcare provider and family.

## 2017-09-09 NOTE — Progress Notes (Signed)
Patient ID: Dominique [name], female   DOB: [birth year], 57 y.o.   MRN: [id number]   Location:  [facility]  Place of Service:  OFFICE  Provider: Arletha Grippe, DO  Patient Care Team: Gildardo Cranker, DO as PCP - General (Internal Medicine)  Extended Emergency Contact Information Primary Emergency Contact: Zelder,Martin Address: [address] [place], [place] [address] Johnnette [place] of [place] Phone: [phone] Mobile Phone: [phone] Relation: Spouse Secondary Emergency Contact: Quincy Simmonds [place] of [place] Phone: [phone] Mobile Phone: [phone] Relation: Daughter  Code Status: full code Goals of Care: Advanced Directive information Advanced Directives 01/12/2017  Does Patient Have a Medical Advance Directive? No  Would patient like information on creating a medical advance directive? No - Patient declined     Chief Complaint  Patient presents with  . Annual Exam    Yearly exam, EKG, fasting for labs    HPI: Patient is a 57 y.o. female seen in today for an annual wellness exam.  She has not seen GYN yet and prefers to get pap today.  Anxiety/depression - mood stable on bupropion SR 423m daily and cymbalta 477mdaily. She had an emotional set back about 1 month ago but sx's resolved . She has a psEngineer, waternd an appt to see psych on Feb 27, 2017. No SI/HI  GERD - controlled on omeprazole daily  Tremor - benign essential. controlled on propanolol  Hx Fe deficiency anemia - she req'd blood transfusion in 1992 due to incomplete placental delivery. She has intermittent restless leg that improves when she takes iron tabs. Hgb 12.6  OSA - controlled on CPAP qhs. Last sleep study about 4 yrs ago. She does not recall her settings. She uses HFA prn  Hx prolapsed cervix  - asymptomatic. No changes  Depression screen PHQ 2/9 02/11/2017  Decreased Interest 0  Down, Depressed, Hopeless 2  PHQ - 2 Score 2    Fall Risk  02/11/2017  01/12/2017  Falls in the past year? No No   No flowsheet data found.   Health Maintenance  Topic Date Due  . Hepatitis C Screening  11October 24, 1962. HIV Screening  01/12/1976  . TETANUS/TDAP  01/12/1980  . PAP SMEAR  01/11/1982  . MAMMOGRAM  01/12/2011  . INFLUENZA VACCINE  09/24/2017  . COLONOSCOPY  05/08/2021    Past Medical History:  Diagnosis Date  . Anxiety   . Asthma   . Cervix prolapsed into vagina    Uro-Gyne Exam 2011, 2014  . Depression   . GERD (gastroesophageal reflux disease)   . Hearing loss   . High cholesterol   . Overweight   . Sleep apnea   . Tremor     Past Surgical History:  Procedure Laterality Date  . DILATION AND CURETTAGE OF UTERUS  02/24/1990  . MOUTH SURGERY     3 teeth removed   . ROOT CANAL    . ROOT CANAL     2 times   . VAGINAL DELIVERY     1990  . VAGINAL DELIVERY     1986    Family History  Problem Relation Age of Onset  . Hypertension Mother   . High Cholesterol Mother   . Cataracts Mother   . Retinal detachment Mother   . Hypertension Brother   . High Cholesterol Brother   . Depression Daughter   . Menstrual problems Daughter   . ADD / ADHD Son   .  Depression Son   . Heart disease Maternal Uncle   . Diabetes Paternal Aunt   . Diabetes Paternal Grandfather   . Arthritis Paternal Grandfather    Family Status  Relation Name Status  . Mother Gust Rung  . Father Trish Mage  . Sister Meyer Russel  . Brother [employer]  . Daughter Patent attorney  . Daughter [employer]  . Son Tama Gander  . Mat Uncle  (Not Specified)  . Ethlyn Daniels  (Not Specified)  . PGF  (Not Specified)    Social History   Socioeconomic History  . Marital status: Married    Spouse name: Not on file  . Number of children: Not on file  . Years of education: Not on file  . Highest education level: Not on file  Occupational History  . Not on file  Social Needs  . Financial resource strain: Not on file  . Food insecurity:    Worry: Not on file     Inability: Not on file  . Transportation needs:    Medical: Not on file    Non-medical: Not on file  Tobacco Use  . Smoking status: Never Smoker  . Smokeless tobacco: Never Used  Substance and Sexual Activity  . Alcohol use: Yes    Comment: 4 drinks per week   . Drug use: No  . Sexual activity: Not Currently  Lifestyle  . Physical activity:    Days per week: Not on file    Minutes per session: Not on file  . Stress: Not on file  Relationships  . Social connections:    Talks on phone: Not on file    Gets together: Not on file    Attends religious service: Not on file    Active member of club or organization: Not on file    Attends meetings of clubs or organizations: Not on file    Relationship status: Not on file  . Intimate partner violence:    Fear of current or ex partner: Not on file    Emotionally abused: Not on file    Physically abused: Not on file    Forced sexual activity: Not on file  Other Topics Concern  . Not on file  Social History Narrative   As of 11/11/16   Diet: Minimal red meat, lactose intolerance       Caffeine: Yes      Married, if yes what year: Yes, 2006      Do you live in a house, apartment, assisted living, condo, trailer, ect: House, 2 stories, 2 persons      Pets: 1 dog      Current/Past profession: Pediatrician       Exercise: A little, walking dog and elliptical          Living Will: No   DNR: No   POA/HPOA: No      Functional Status:   Do you have difficulty bathing or dressing yourself? No   Do you have difficulty preparing food or eating? No   Do you have difficulty managing your medications? No    Do you have difficulty managing your finances? No    Do you have difficulty affording your medications? No     Allergies  Allergen Reactions  . Bactrim [Sulfamethoxazole-Trimethoprim] Rash  . Sulfa Antibiotics Rash  . Sulfur Rash    Allergies as of 09/09/2017      Reactions   Bactrim [sulfamethoxazole-trimethoprim]  Rash   Sulfa Antibiotics Rash  Sulfur Rash      Medication List        Accurate as of 09/09/17 10:12 AM. Always use your most recent med list.          albuterol 108 (90 Base) MCG/ACT inhaler Commonly known as:  PROVENTIL HFA;VENTOLIN HFA Inhale 1 puff into the lungs every 6 (six) hours as needed for wheezing or shortness of breath.   aspirin EC 81 MG tablet Take 81 mg by mouth daily.   buPROPion 150 MG 12 hr tablet Commonly known as:  WELLBUTRIN SR Take 1 tablet (150 mg total) by mouth 3 (three) times daily.   DULoxetine 20 MG capsule Commonly known as:  CYMBALTA Take 2 capsules (40 mg total) by mouth daily.   ferrous sulfate 325 (65 FE) MG tablet Take 325 mg by mouth. 3-4 nights weekly   FIBER ADULT GUMMIES PO Take by mouth. Takes half dose daily for constipation   omeprazole 20 MG capsule Commonly known as:  PRILOSEC Take 1 capsule (20 mg total) by mouth daily.   propranolol 80 MG tablet Commonly known as:  INDERAL Take 1 tablet (80 mg total) by mouth daily.   propranolol 60 MG tablet Commonly known as:  INDERAL TAKE 1 AND 1/2 TABLET BY MOUTH DAILY FOR TREMORS.   propranolol 40 MG tablet Commonly known as:  INDERAL TAKE 1 TABLET BY MOUTH DAILY FOR TREMORS        Review of Systems:  Review of Systems  Gastrointestinal: Positive for abdominal pain (lower) and diarrhea.  Psychiatric/Behavioral: Positive for dysphoric mood.  All other systems reviewed and are negative.   Physical Exam: Vitals:   09/09/17 0953  BP: 130/88  Pulse: (!) 59  Resp: 18  Temp: 97.7 F (36.5 C)  TempSrc: Oral  SpO2: 96%  Weight: 145 lb 6.4 oz (66 kg)  Height: 5' 3" (1.6 m)   Body mass index is 25.76 kg/m. Physical Exam  Constitutional: She is oriented to person, place, and time. She appears well-developed and well-nourished. No distress.  HENT:  Head: Normocephalic and atraumatic.  Right Ear: Hearing, tympanic membrane, external ear and ear canal normal.  Left  Ear: Hearing, tympanic membrane, external ear and ear canal normal.  Mouth/Throat: Uvula is midline, oropharynx is clear and moist and mucous membranes are normal. She does not have dentures.  Eyes: Pupils are equal, round, and reactive to light. Conjunctivae, EOM and lids are normal. No scleral icterus.  Neck: Trachea normal and normal range of motion. Neck supple. Carotid bruit is not present. No thyroid mass and no thyromegaly present.  Cardiovascular: Normal rate, regular rhythm, normal heart sounds and intact distal pulses. Exam reveals no gallop and no friction rub.  No murmur heard. No LE edema b/l. No calf TTP.   Pulmonary/Chest: Effort normal and breath sounds normal. She has no wheezes. She has no rhonchi. She has no rales. She exhibits no mass. Right breast exhibits no inverted nipple, no mass, no nipple discharge, no skin change and no tenderness. Left breast exhibits no inverted nipple, no mass, no nipple discharge, no skin change and no tenderness. Breasts are symmetrical.  Abdominal: Soft. Normal appearance, normal aorta and bowel sounds are normal. She exhibits no pulsatile midline mass and no mass. There is no hepatosplenomegaly. There is tenderness (RLQ; lower abdomen). There is no rigidity, no rebound and no guarding. No hernia.  Genitourinary: Rectum normal, vagina normal and uterus normal. Rectal exam shows no fissure, anal tone normal and guaiac negative stool.  Cervix exhibits no motion tenderness, no discharge and no friability. Right adnexum displays no mass, no tenderness and no fullness. Left adnexum displays no mass, no tenderness and no fullness.    Musculoskeletal: Normal range of motion. She exhibits edema (right). She exhibits no deformity.  Lymphadenopathy:       Head (right side): No posterior auricular adenopathy present.       Head (left side): No posterior auricular adenopathy present.    She has no cervical adenopathy.       Right: No supraclavicular adenopathy  present.       Left: No supraclavicular adenopathy present.  Neurological: She is alert and oriented to person, place, and time. She has normal strength and normal reflexes. No cranial nerve deficit. Gait normal.  Skin: Skin is warm, dry and intact. No rash noted. Nails show no clubbing.  Psychiatric: She has a normal mood and affect. Her speech is normal and behavior is normal. Judgment and thought content normal. Cognition and memory are normal.    Labs reviewed:  Basic Metabolic Panel: Recent Labs    11/12/16 1008  NA 142  K 4.2  CL 107  CO2 28  GLUCOSE 89  BUN 12  CREATININE 0.98  CALCIUM 8.9  TSH 4.44   Liver Function Tests: Recent Labs    11/12/16 1008  AST 16  ALT 15  BILITOT 0.4  PROT 6.1   No results for input(s): LIPASE, AMYLASE in the last 8760 hours. No results for input(s): AMMONIA in the last 8760 hours. CBC: Recent Labs    11/12/16 1008  WBC 5.1  NEUTROABS 2,815  HGB 12.6  HCT 36.6  MCV 91.7  PLT 157   Lipid Panel: Recent Labs    11/12/16 1008  CHOL 216*  HDL 34*  LDLCALC 137*  TRIG 314*  CHOLHDL 6.4*   No results found for: HGBA1C  Procedures: No results found. ECG OBTAINED AND REVIEWED BY MYSELF: SB @ 54 bpm, nml axis, LAE, T wave inverted V1-V6, T flat lateral leads; poor R wave progression; no acute ischemic changes; no other ECG available to compare   Assessment/Plan   ICD-10-CM   1. Well adult exam Z00.00 EKG 12-Lead    PAP, Image Guided [[facility]]    CANCELED: PAP [[facility]]  2. Mixed hyperlipidemia E78.2 Lipid Panel    ECHOCARDIOGRAM LIMITED  3. High risk medication use Z79.899 EKG 12-Lead    CMP with eGFR([facility])    CBC with Differential/Platelets    ECHOCARDIOGRAM LIMITED  4. Benign essential tremor G25.0   5. Anxiety and depression F41.9 TSH   F32.9 Vitamin B12    Vitamin D, 25-hydroxy  6. Abnormal ECG R94.31 ECHOCARDIOGRAM LIMITED  7. Lower abdominal pain R10.30 Urinalysis with Reflex Microscopic    Continue current medications as ordered  Will call with lab results  Will call with 2D echocardiogram results  Follow up with specialists as scheduled  Follow up in 6 mos with Janett Billow for anxiety/depression, BET, OSA.  Keeping You Healthy handout given   Cordella Register. Perlie Gold  [facility] [address] [place], [place] [address] [phone] Cell (Monday-Friday 8 AM - 5 PM) [phone] After 5 PM and follow prompts

## 2017-09-10 ENCOUNTER — Other Ambulatory Visit: Payer: Self-pay | Admitting: Internal Medicine

## 2017-09-10 DIAGNOSIS — R9431 Abnormal electrocardiogram [ECG] [EKG]: Secondary | ICD-10-CM

## 2017-09-10 DIAGNOSIS — Z79899 Other long term (current) drug therapy: Secondary | ICD-10-CM

## 2017-09-10 DIAGNOSIS — E782 Mixed hyperlipidemia: Secondary | ICD-10-CM

## 2017-09-10 LAB — COMPLETE METABOLIC PANEL WITH GFR
AG RATIO: 2.3 (calc) (ref 1.0–2.5)
ALT: 15 U/L (ref 6–29)
AST: 14 U/L (ref 10–35)
Albumin: 4.4 g/dL (ref 3.6–5.1)
Alkaline phosphatase (APISO): 90 U/L (ref 33–130)
BUN: 12 mg/dL (ref 7–25)
CHLORIDE: 106 mmol/L (ref 98–110)
CO2: 28 mmol/L (ref 20–32)
Calcium: 9.3 mg/dL (ref 8.6–10.4)
Creat: 1.01 mg/dL (ref 0.50–1.05)
GFR, EST NON AFRICAN AMERICAN: 62 mL/min/{1.73_m2} (ref >=60)
GFR, Est African American: 72 mL/min/{1.73_m2} (ref >=60)
Globulin: 1.9 g/dL (calc) (ref 1.9–3.7)
Glucose, Bld: 96 mg/dL (ref 65–99)
POTASSIUM: 4.3 mmol/L (ref 3.5–5.3)
Sodium: 141 mmol/L (ref 135–146)
TOTAL PROTEIN: 6.3 g/dL (ref 6.1–8.1)
Total Bilirubin: 0.5 mg/dL (ref 0.2–1.2)

## 2017-09-10 LAB — CBC WITH DIFFERENTIAL/PLATELET
BASOS PCT: 1.2 %
Basophils Absolute: 70 cells/uL (ref 0–200)
Eosinophils Absolute: 162 cells/uL (ref 15–500)
Eosinophils Relative: 2.8 %
HCT: 37.1 % (ref 35.0–45.0)
Hemoglobin: 12.6 g/dL (ref 11.7–15.5)
Lymphs Abs: 1775 cells/uL (ref 850–3900)
MCH: 31.2 pg (ref 27.0–33.0)
MCHC: 34 g/dL (ref 32.0–36.0)
MCV: 91.8 fL (ref 80.0–100.0)
MONOS PCT: 7.8 %
MPV: 10.9 fL (ref 7.5–12.5)
Neutro Abs: 3341 cells/uL (ref 1500–7800)
Neutrophils Relative %: 57.6 %
PLATELETS: 163 10*3/uL (ref 140–400)
RBC: 4.04 10*6/uL (ref 3.80–5.10)
RDW: 12.3 % (ref 11.0–15.0)
TOTAL LYMPHOCYTE: 30.6 %
WBC mixed population: 452 cells/uL (ref 200–950)
WBC: 5.8 10*3/uL (ref 3.8–10.8)

## 2017-09-10 LAB — URINALYSIS, ROUTINE W REFLEX MICROSCOPIC
BILIRUBIN URINE: NEGATIVE
Bacteria, UA: NONE SEEN /HPF
GLUCOSE, UA: NEGATIVE
HGB URINE DIPSTICK: NEGATIVE
Hyaline Cast: NONE SEEN /LPF
KETONES UR: NEGATIVE
Nitrite: NEGATIVE
PH: 6.5 (ref 5.0–8.0)
Protein, ur: NEGATIVE
Specific Gravity, Urine: 1.018 (ref 1.001–1.03)

## 2017-09-10 LAB — LIPID PANEL
Cholesterol: 213 mg/dL — ABNORMAL HIGH (ref <200)
HDL: 35 mg/dL — AB (ref >50)
LDL Cholesterol (Calc): 139 mg/dL (calc) — ABNORMAL HIGH
NON-HDL CHOLESTEROL (CALC): 178 mg/dL — AB (ref <130)
TRIGLYCERIDES: 240 mg/dL — AB (ref <150)
Total CHOL/HDL Ratio: 6.1 (calc) — ABNORMAL HIGH (ref <5.0)

## 2017-09-10 LAB — PAP IG (IMAGE GUIDED)

## 2017-09-10 LAB — VITAMIN B12: Vitamin B-12: 485 pg/mL (ref 200–1100)

## 2017-09-10 LAB — VITAMIN D 25 HYDROXY (VIT D DEFICIENCY, FRACTURES): Vit D, 25-Hydroxy: 28 ng/mL — ABNORMAL LOW (ref 30–100)

## 2017-09-10 LAB — TSH: TSH: 5.3 mIU/L — ABNORMAL HIGH (ref 0.40–4.50)

## 2017-09-11 ENCOUNTER — Other Ambulatory Visit: Payer: Self-pay

## 2017-09-11 DIAGNOSIS — R7989 Other specified abnormal findings of blood chemistry: Secondary | ICD-10-CM

## 2017-09-18 ENCOUNTER — Other Ambulatory Visit: Payer: Self-pay

## 2017-09-18 ENCOUNTER — Ambulatory Visit (HOSPITAL_COMMUNITY): Payer: PRIVATE HEALTH INSURANCE | Attending: Cardiovascular Disease

## 2017-09-18 DIAGNOSIS — Z79899 Other long term (current) drug therapy: Secondary | ICD-10-CM | POA: Insufficient documentation

## 2017-09-18 DIAGNOSIS — R9431 Abnormal electrocardiogram [ECG] [EKG]: Secondary | ICD-10-CM | POA: Insufficient documentation

## 2017-09-18 DIAGNOSIS — E782 Mixed hyperlipidemia: Secondary | ICD-10-CM | POA: Insufficient documentation

## 2017-10-14 ENCOUNTER — Encounter: Payer: Self-pay | Admitting: Internal Medicine

## 2017-10-20 ENCOUNTER — Telehealth: Payer: Self-pay | Admitting: *Deleted

## 2017-10-20 DIAGNOSIS — R9431 Abnormal electrocardiogram [ECG] [EKG]: Secondary | ICD-10-CM

## 2017-10-20 NOTE — Telephone Encounter (Signed)
Patient called and stated that she has not heard about her Echo results she had a couple of weeks ago and wants to know the results. And also wants to schedule a follow up lab appointment.    Notes recorded by Gildardo Cranker, DO on 09/22/2017 at 12:42 PM EDT Heart ultrasound reveals normal heart function and no significant valve abnormalities that would contribute to abnormal ECG done in the office - recommend cardiology follow up for further workup of abnormal ECG   Patient notified of results. Referral placed for Cardiology appointment (no history of Cardiologist) Lab appointment scheduled for 11/18/17.

## 2017-11-18 ENCOUNTER — Other Ambulatory Visit: Payer: BLUE CROSS/BLUE SHIELD

## 2017-11-18 ENCOUNTER — Encounter (INDEPENDENT_AMBULATORY_CARE_PROVIDER_SITE_OTHER): Payer: Self-pay

## 2017-11-18 DIAGNOSIS — R7989 Other specified abnormal findings of blood chemistry: Secondary | ICD-10-CM

## 2017-11-18 LAB — TSH: TSH: 5.71 mIU/L — ABNORMAL HIGH (ref 0.40–4.50)

## 2017-11-23 ENCOUNTER — Ambulatory Visit: Payer: PRIVATE HEALTH INSURANCE | Admitting: Cardiology

## 2017-11-27 ENCOUNTER — Other Ambulatory Visit: Payer: Self-pay

## 2017-11-27 DIAGNOSIS — K219 Gastro-esophageal reflux disease without esophagitis: Secondary | ICD-10-CM

## 2017-11-27 MED ORDER — OMEPRAZOLE 20 MG PO CPDR: 20.0000 mg | DELAYED_RELEASE_CAPSULE | Freq: Every day | ORAL | 3 refills | Status: DC

## 2017-12-23 ENCOUNTER — Ambulatory Visit (INDEPENDENT_AMBULATORY_CARE_PROVIDER_SITE_OTHER): Payer: PRIVATE HEALTH INSURANCE | Admitting: Cardiology

## 2017-12-23 ENCOUNTER — Encounter: Payer: Self-pay | Admitting: Cardiology

## 2017-12-23 VITALS — BP 116/76 | HR 70 | Ht 63.0 in | Wt 150.8 lb

## 2017-12-23 DIAGNOSIS — E782 Mixed hyperlipidemia: Secondary | ICD-10-CM | POA: Diagnosis not present

## 2017-12-23 DIAGNOSIS — R9431 Abnormal electrocardiogram [ECG] [EKG]: Secondary | ICD-10-CM

## 2017-12-23 DIAGNOSIS — R072 Precordial pain: Secondary | ICD-10-CM

## 2017-12-23 DIAGNOSIS — G250 Essential tremor: Secondary | ICD-10-CM | POA: Diagnosis not present

## 2017-12-23 NOTE — Progress Notes (Signed)
PCP: Gildardo Cranker, DO  Clinic Note: Chief Complaint  Patient presents with  . New Patient (Initial Visit)    Abnormal EKG  . Edema    HPI: Dominique Dominique is a 57 y.o. female who is being seen today for the evaluation of abnormal EKG at the request of Gildardo Cranker, DO.  Dominique Dominique was seen on July 17 by Gildardo Cranker, DO for initial evaluation and was noted to have an abnormal EKG.  This was followed up with a 2D echocardiogram - result reviewed below -  which is relatively normal.  Despite this, she is referred for cardiology evaluation.  Recent Hospitalizations: None  Studies Personally Reviewed - (if available, images/films reviewed: From Epic Chart or Care Everywhere)  2 D Echo 7/26 - Normal LV size & function.  EF 60-65%. No RWMA.  Normal Valves.   Interval History: Dominique Dominique presents here today because of her EKG.  She has maybe some mild swelling in her legs (L> R).  She has occasional sharp left-sided chest discomfort off and on, but not really with exertion --mostly at rest. Apparently a couple years ago she was seen by cardiologist in [place] for an abnormal EKG and was told that over the he was fine. She has been relatively asymptomatic she is got borderline hyperlipidemia.  Only her maternal grandmother had coronary disease with CABG in a later age.  Several members her family of hypertension and hyperlipidemia.  Dominique Dominique really denies any exertional chest pain.   No exertional dyspnea, unless she overexerts..She denies any PND orthopnea.   No palpitations, lightheadedness, dizziness, weakness or syncope/near syncope. No TIA/amaurosis fugax symptoms.  No claudication.  ROS: A comprehensive was performed. Review of Systems  Constitutional: Negative for malaise/fatigue and weight loss.  HENT: Negative for congestion and nosebleeds.   Respiratory: Negative for cough, shortness of breath and wheezing.   Gastrointestinal: Negative for blood in stool,  melena and nausea.  Genitourinary: Negative for hematuria.  Musculoskeletal: Positive for joint pain (Mild arthritis).  Neurological: Negative for dizziness and weakness.  Psychiatric/Behavioral: Negative for depression and memory loss. The patient is not nervous/anxious and does not have insomnia.   All other systems reviewed and are negative.  I have reviewed and (if needed) personally updated the patient's problem list, medications, allergies, past medical and surgical history, social and family history.   Past Medical History:  Diagnosis Date  . Anxiety   . Asthma   . Cervix prolapsed into vagina    Uro-Gyne Exam 2011, 2014  . Depression   . GERD (gastroesophageal reflux disease)   . Hearing loss   . High cholesterol   . Overweight   . Sleep apnea   . Tremor     Past Surgical History:  Procedure Laterality Date  . DILATION AND CURETTAGE OF UTERUS  02/24/1990  . MOUTH SURGERY     3 teeth removed   . ROOT CANAL    . ROOT CANAL     2 times   . VAGINAL DELIVERY     1990  . VAGINAL DELIVERY     1986    Current Meds  Medication Sig  . propranolol (INDERAL) 60 MG tablet Take 60 mg by mouth 2 (two) times daily.    Allergies  Allergen Reactions  . Bactrim [Sulfamethoxazole-Trimethoprim] Rash  . Sulfa Antibiotics Rash  . Sulfur Rash    Social History   Tobacco Use  . Smoking status: Never Smoker  . Smokeless tobacco: Never  Used  Substance Use Topics  . Alcohol use: Yes    Comment: 4 drinks per week   . Drug use: No   Social History   Social History Narrative   As of 11/11/16   Diet: Minimal red meat, lactose intolerance       Caffeine: Yes      Married, if yes what year: Yes, 2006      Do you live in a house, apartment, assisted living, condo, trailer, ect: House, 2 stories, 2 persons      Pets: 1 dog      Current/Past profession: Pediatrician       Exercise: A little, walking dog and elliptical          Living Will: No   DNR: No   POA/HPOA:  No      Functional Status:   Do you have difficulty bathing or dressing yourself? No   Do you have difficulty preparing food or eating? No   Do you have difficulty managing your medications? No    Do you have difficulty managing your finances? No    Do you have difficulty affording your medications? No    Family History family history includes ADD / ADHD in her son; Arthritis in her paternal grandfather; Cataracts in her mother; Depression in her daughter and son; Diabetes in her paternal aunt and paternal grandfather; Heart disease in her maternal uncle; High Cholesterol in her brother and mother; Hypertension in her brother and mother; Menstrual problems in her daughter; Retinal detachment in her mother.  Wt Readings from Last 3 Encounters:  12/23/17 150 lb 12.8 oz (68.4 kg)  09/09/17 145 lb 6.4 oz (66 kg)  02/11/17 142 lb 9.6 oz (64.7 kg)    PHYSICAL EXAM BP 116/76   Pulse 70   Ht 5\' 3"  (1.6 m)   Wt 150 lb 12.8 oz (68.4 kg)   BMI 26.71 kg/m  Physical Exam  Constitutional: She is oriented to person, place, and time. She appears well-developed and well-nourished. No distress.  Healthy-appearing.  Well-groomed  HENT:  Head: Normocephalic and atraumatic.  Mouth/Throat: Oropharynx is clear and moist.  Eyes: Pupils are equal, round, and reactive to light. Conjunctivae and EOM are normal. Scleral icterus is present.  Neck: No hepatojugular reflux and no JVD present. Carotid bruit is not present.  Cardiovascular: Normal rate, regular rhythm, normal heart sounds and intact distal pulses.  No extrasystoles are present. PMI is not displaced. Exam reveals no gallop and no friction rub.  No murmur heard. Pulmonary/Chest: Effort normal and breath sounds normal. No respiratory distress. She has no wheezes. She has no rales.  Abdominal: Soft. Bowel sounds are normal. She exhibits no distension. There is no tenderness. There is no rebound.  Musculoskeletal: Normal range of motion. She exhibits  no edema (Trivial).  Neurological: She is alert and oriented to person, place, and time. No cranial nerve deficit.  Skin: Skin is warm and dry. No erythema.  Psychiatric: She has a normal mood and affect. Her behavior is normal. Judgment and thought content normal.  Vitals reviewed.    Adult ECG Report  Rate: 68;  Rhythm: normal sinus rhythm and Nonspecific ST-T wave changes.  Mostly noted in the inferior and anterolateral leads.  Not exclude ischemia.;   Narrative Interpretation: Mildly abnormal EKG with no specific findings.   Other studies Reviewed: Additional studies/ records that were reviewed today include:  Recent Labs: K PN labs from July 2019: TC 230, TG 240, HDL 35, LDL  35.  Creatinine 1.01.   ASSESSMENT / PLAN: Problem List Items Addressed This Visit    Benign essential tremor    Relatively well controlled with propranolol.  She is not on propranolol for blood pressure or palpitations.      Mixed hyperlipidemia    Newly established with PCP.  Plan for now is to treat with lifestyle modification diet and exercise.  Coronary calcification on coronary CTA will help [place] determine if we need to be more aggressive.      Relevant Medications   propranolol (INDERAL) 60 MG tablet   Nonspecific abnormal electrocardiogram (ECG) - Primary    Mostly nonspecific findings on EKG with a relatively normal echocardiogram and no active symptoms.  With her cardiac risk factors of hyperlipidemia with family history of hyper lipid edema and hypertension, we discussed basic risk stratification.  She has some nonspecific symptoms but not likely to be cardiac in nature. I think the best screening study for her would be a coronary calcium score.  This could help [place] direct further evaluation. Plan: Coronary calcium score      Relevant Orders   EKG 12-Lead (Completed)   CT CARDIAC SCORING   Precordial chest pain    The sharp twinging chest chest pain symptoms she is having sounds more  musculoskeletal.  However in light of her EKG, we can check a coronary calcium score just to determine if there is any possible CAD.      Relevant Orders   EKG 12-Lead (Completed)   CT CARDIAC SCORING    Other Visit Diagnoses    Abnormal electrocardiogram       Relevant Orders   CT CARDIAC SCORING       Current medicines are reviewed at length with the patient today.  (+/- concerns) none The following changes have been made:  None  Patient Instructions  Medication Instructions:  NOT NEEDED  If you need a refill on your cardiac medications before your next appointment, please call your pharmacy.   Lab work: NOT NEEDED  If you have labs (blood work) drawn today and your tests are completely normal, you will receive your results only by: Marland Kitchen MyChart Message (if you have MyChart) OR . A paper copy in the mail If you have any lab test that is abnormal or we need to change your treatment, we will call you to review the results.  Testing/Procedures:  CT coronary calcium score. This test is done at [address].   Coronary CalciumScan A coronary calcium scan is an imaging test used to look for deposits of calcium and other fatty materials (plaques) in the inner lining of the blood vessels of the heart (coronary arteries). These deposits of calcium and plaques can partly clog and narrow the coronary arteries without producing any symptoms or warning signs. This puts a person at risk for a heart attack. This test can detect these deposits before symptoms develop. Tell a health care provider about:  Any allergies you have.  All medicines you are taking, including vitamins, herbs, eye drops, creams, and over-the-counter medicines.  Any problems you or family members have had with anesthetic medicines.  Any blood disorders you have.  Any surgeries you have had.  Any medical conditions you have.  Whether you are pregnant or may be pregnant. What are the  risks? Generally, this is a safe procedure. However, problems may occur, including:  Harm to a pregnant woman and her unborn baby. This test involves the  use of radiation. Radiation exposure can be dangerous to a pregnant woman and her unborn baby. If you are pregnant, you generally should not have this procedure done.  Slight increase in the risk of cancer. This is because of the radiation involved in the test. What happens before the procedure? No preparation is needed for this procedure. What happens during the procedure?  You will undress and remove any jewelry around your neck or chest.  You will put on a hospital gown.  Sticky electrodes will be placed on your chest. The electrodes will be connected to an electrocardiogram (ECG) machine to record a tracing of the electrical activity of your heart.  A CT scanner will take pictures of your heart. During this time, you will be asked to lie still and hold your breath for 2-3 seconds while a picture of your heart is being taken. The procedure may vary among health care providers and hospitals. What happens after the procedure?  You can get dressed.  You can return to your normal activities.  It is up to you to get the results of your test. Ask your health care provider, or the department that is doing the test, when your results will be ready. Summary  A coronary calcium scan is an imaging test used to look for deposits of calcium and other fatty materials (plaques) in the inner lining of the blood vessels of the heart (coronary arteries).  Generally, this is a safe procedure. Tell your health care provider if you are pregnant or may be pregnant.  No preparation is needed for this procedure.  A CT scanner will take pictures of your heart.  You can return to your normal activities after the scan is done. This information is not intended to replace advice given to you by your health care provider. Make sure you discuss any  questions you have with your health care provider. Document Released: 08/09/2007 Document Revised: 12/31/2015 Document Reviewed: 12/31/2015 Elsevier Interactive Patient Education  2017 [place]: At [facility], you and your health needs are our priority.  As part of our continuing mission to provide you with exceptional heart care, we have created designated Provider Care Teams.  These Care Teams include your primary Cardiologist (physician) and Advanced Practice Providers (APPs -  Physician Assistants and Nurse Practitioners) who all work together to provide you with the care you need, when you need it. . Your physician recommends that you schedule a follow-up appointment in  1 TO 2 MONTHS WITH DR HARDING. IF NORMAL CAN COME BACK ON AN AS NEEDED BASIS. Marland Kitchen   Any Other Special Instructions Will Be Listed Below (If Applicable).       Studies Ordered:   Orders Placed This Encounter  Procedures  . CT CARDIAC SCORING  . EKG 12-Lead      Glenetta Hew, M.D., M.S. Interventional Cardiologist   Pager # [phone] Phone # [phone] [address] [place], [place] [address]   Thank you for choosing Heartcare at [facility]!!

## 2017-12-23 NOTE — Patient Instructions (Signed)
Medication Instructions:  NOT NEEDED  If you need a refill on your cardiac medications before your next appointment, please call your pharmacy.   Lab work: NOT NEEDED  If you have labs (blood work) drawn today and your tests are completely normal, you will receive your results only by: Marland Kitchen MyChart Message (if you have MyChart) OR . A paper copy in the mail If you have any lab test that is abnormal or we need to change your treatment, we will call you to review the results.  Testing/Procedures:  CT coronary calcium score. This test is done at [address].   Coronary CalciumScan A coronary calcium scan is an imaging test used to look for deposits of calcium and other fatty materials (plaques) in the inner lining of the blood vessels of the heart (coronary arteries). These deposits of calcium and plaques can partly clog and narrow the coronary arteries without producing any symptoms or warning signs. This puts a person at risk for a heart attack. This test can detect these deposits before symptoms develop. Tell a health care provider about:  Any allergies you have.  All medicines you are taking, including vitamins, herbs, eye drops, creams, and over-the-counter medicines.  Any problems you or family members have had with anesthetic medicines.  Any blood disorders you have.  Any surgeries you have had.  Any medical conditions you have.  Whether you are pregnant or may be pregnant. What are the risks? Generally, this is a safe procedure. However, problems may occur, including:  Harm to a pregnant woman and her unborn baby. This test involves the use of radiation. Radiation exposure can be dangerous to a pregnant woman and her unborn baby. If you are pregnant, you generally should not have this procedure done.  Slight increase in the risk of cancer. This is because of the radiation involved in the test. What happens before the procedure? No preparation is needed for  this procedure. What happens during the procedure?  You will undress and remove any jewelry around your neck or chest.  You will put on a hospital gown.  Sticky electrodes will be placed on your chest. The electrodes will be connected to an electrocardiogram (ECG) machine to record a tracing of the electrical activity of your heart.  A CT scanner will take pictures of your heart. During this time, you will be asked to lie still and hold your breath for 2-3 seconds while a picture of your heart is being taken. The procedure may vary among health care providers and hospitals. What happens after the procedure?  You can get dressed.  You can return to your normal activities.  It is up to you to get the results of your test. Ask your health care provider, or the department that is doing the test, when your results will be ready. Summary  A coronary calcium scan is an imaging test used to look for deposits of calcium and other fatty materials (plaques) in the inner lining of the blood vessels of the heart (coronary arteries).  Generally, this is a safe procedure. Tell your health care provider if you are pregnant or may be pregnant.  No preparation is needed for this procedure.  A CT scanner will take pictures of your heart.  You can return to your normal activities after the scan is done. This information is not intended to replace advice given to you by your health care provider. Make sure you discuss any questions you have  with your health care provider. Document Released: 08/09/2007 Document Revised: 12/31/2015 Document Reviewed: 12/31/2015 Elsevier Interactive Patient Education  2017 [place]: At [facility], you and your health needs are our priority.  As part of our continuing mission to provide you with exceptional heart care, we have created designated Provider Care Teams.  These Care Teams include your primary Cardiologist (physician) and Advanced Practice  Providers (APPs -  Physician Assistants and Nurse Practitioners) who all work together to provide you with the care you need, when you need it. . Your physician recommends that you schedule a follow-up appointment in  1 TO 2 MONTHS WITH DR HARDING. IF NORMAL CAN COME BACK ON AN AS NEEDED BASIS. Marland Kitchen   Any Other Special Instructions Will Be Listed Below (If Applicable).

## 2017-12-24 ENCOUNTER — Encounter: Payer: Self-pay | Admitting: Cardiology

## 2017-12-24 NOTE — Assessment & Plan Note (Addendum)
Newly established with PCP.  Plan for now is to treat with lifestyle modification diet and exercise.  Coronary calcification on coronary CTA will help [place] determine if we need to be more aggressive.

## 2017-12-24 NOTE — Assessment & Plan Note (Signed)
The sharp twinging chest chest pain symptoms she is having sounds more musculoskeletal.  However in light of her EKG, we can check a coronary calcium score just to determine if there is any possible CAD.

## 2017-12-24 NOTE — Assessment & Plan Note (Signed)
Mostly nonspecific findings on EKG with a relatively normal echocardiogram and no active symptoms.  With her cardiac risk factors of hyperlipidemia with family history of hyper lipid edema and hypertension, we discussed basic risk stratification.  She has some nonspecific symptoms but not likely to be cardiac in nature. I think the best screening study for her would be a coronary calcium score.  This could help [place] direct further evaluation. Plan: Coronary calcium score

## 2017-12-24 NOTE — Assessment & Plan Note (Signed)
Relatively well controlled with propranolol.  She is not on propranolol for blood pressure or palpitations.

## 2017-12-25 ENCOUNTER — Ambulatory Visit: Payer: PRIVATE HEALTH INSURANCE

## 2018-01-07 ENCOUNTER — Inpatient Hospital Stay: Admission: RE | Admit: 2018-01-07 | Payer: PRIVATE HEALTH INSURANCE | Source: Ambulatory Visit

## 2018-01-08 ENCOUNTER — Inpatient Hospital Stay: Admission: RE | Admit: 2018-01-08 | Payer: PRIVATE HEALTH INSURANCE | Source: Ambulatory Visit

## 2018-01-08 ENCOUNTER — Ambulatory Visit (INDEPENDENT_AMBULATORY_CARE_PROVIDER_SITE_OTHER)
Admission: RE | Admit: 2018-01-08 | Discharge: 2018-01-08 | Disposition: A | Discharge status: Home / Self Care | Payer: PRIVATE HEALTH INSURANCE | Source: Ambulatory Visit | Attending: Cardiology | Admitting: Cardiology

## 2018-01-08 DIAGNOSIS — R9431 Abnormal electrocardiogram [ECG] [EKG]: Secondary | ICD-10-CM

## 2018-01-08 DIAGNOSIS — R072 Precordial pain: Secondary | ICD-10-CM

## 2018-01-15 ENCOUNTER — Telehealth: Payer: Self-pay | Admitting: *Deleted

## 2018-01-15 NOTE — Telephone Encounter (Signed)
The patient has been notified of the result and verbalized understanding.  All questions (if any) were answered. Raiford Simmonds, RN 01/15/2018 4:43 PM

## 2018-01-15 NOTE — Telephone Encounter (Signed)
LEFT MESSAGE TO CALL BACK FOR RESULTS.

## 2018-01-15 NOTE — Telephone Encounter (Signed)
-----   Message from Leonie Man, MD sent at 01/11/2018  5:12 PM EST ----- Doristine Devoid news.  Coronary calcium score is essentially 0. Low risk finding.   This suggest low risk for active coronary artery disease.  Glenetta Hew, MD

## 2018-02-05 ENCOUNTER — Ambulatory Visit: Payer: PRIVATE HEALTH INSURANCE | Admitting: Cardiology

## 2018-03-10 ENCOUNTER — Encounter: Payer: Self-pay | Admitting: Nurse Practitioner

## 2018-03-10 ENCOUNTER — Ambulatory Visit (INDEPENDENT_AMBULATORY_CARE_PROVIDER_SITE_OTHER): Payer: PRIVATE HEALTH INSURANCE | Admitting: Nurse Practitioner

## 2018-03-10 VITALS — BP 112/84 | HR 66 | Temp 97.9°F | Ht 63.0 in | Wt 149.2 lb

## 2018-03-10 DIAGNOSIS — F32A Anxiety disorder, unspecified: Secondary | ICD-10-CM

## 2018-03-10 DIAGNOSIS — E782 Mixed hyperlipidemia: Secondary | ICD-10-CM

## 2018-03-10 DIAGNOSIS — R7989 Other specified abnormal findings of blood chemistry: Secondary | ICD-10-CM

## 2018-03-10 DIAGNOSIS — D509 Iron deficiency anemia, unspecified: Secondary | ICD-10-CM

## 2018-03-10 DIAGNOSIS — F419 Anxiety disorder, unspecified: Secondary | ICD-10-CM | POA: Diagnosis not present

## 2018-03-10 DIAGNOSIS — G250 Essential tremor: Secondary | ICD-10-CM | POA: Diagnosis not present

## 2018-03-10 DIAGNOSIS — F329 Major depressive disorder, single episode, unspecified: Secondary | ICD-10-CM

## 2018-03-10 DIAGNOSIS — K219 Gastro-esophageal reflux disease without esophagitis: Secondary | ICD-10-CM

## 2018-03-10 DIAGNOSIS — L710 Perioral dermatitis: Secondary | ICD-10-CM

## 2018-03-10 LAB — COMPLETE METABOLIC PANEL WITH GFR
AG RATIO: 2.5 (calc) (ref 1.0–2.5)
ALT: 20 U/L (ref 6–29)
AST: 21 U/L (ref 10–35)
Albumin: 4.2 g/dL (ref 3.6–5.1)
Alkaline phosphatase (APISO): 79 U/L (ref 33–130)
BUN: 12 mg/dL (ref 7–25)
CO2: 28 mmol/L (ref 20–32)
Calcium: 9.2 mg/dL (ref 8.6–10.4)
Chloride: 109 mmol/L (ref 98–110)
Creat: 0.98 mg/dL (ref 0.50–1.05)
GFR, Est African American: 74 mL/min/{1.73_m2} (ref >=60)
GFR, Est Non African American: 64 mL/min/{1.73_m2} (ref >=60)
GLOBULIN: 1.7 g/dL — AB (ref 1.9–3.7)
GLUCOSE: 89 mg/dL (ref 65–99)
Potassium: 4.3 mmol/L (ref 3.5–5.3)
Sodium: 142 mmol/L (ref 135–146)
Total Bilirubin: 0.6 mg/dL (ref 0.2–1.2)
Total Protein: 5.9 g/dL — ABNORMAL LOW (ref 6.1–8.1)

## 2018-03-10 LAB — CBC WITH DIFFERENTIAL/PLATELET
Absolute Monocytes: 432 cells/uL (ref 200–950)
BASOS PCT: 1.2 %
Basophils Absolute: 62 cells/uL (ref 0–200)
Eosinophils Absolute: 203 cells/uL (ref 15–500)
Eosinophils Relative: 3.9 %
HCT: 36.2 % (ref 35.0–45.0)
Hemoglobin: 12.5 g/dL (ref 11.7–15.5)
Lymphs Abs: 1466 cells/uL (ref 850–3900)
MCH: 31.7 pg (ref 27.0–33.0)
MCHC: 34.5 g/dL (ref 32.0–36.0)
MCV: 91.9 fL (ref 80.0–100.0)
MPV: 10.8 fL (ref 7.5–12.5)
Monocytes Relative: 8.3 %
Neutro Abs: 3037 cells/uL (ref 1500–7800)
Neutrophils Relative %: 58.4 %
Platelets: 173 10*3/uL (ref 140–400)
RBC: 3.94 10*6/uL (ref 3.80–5.10)
RDW: 12.3 % (ref 11.0–15.0)
TOTAL LYMPHOCYTE: 28.2 %
WBC: 5.2 10*3/uL (ref 3.8–10.8)

## 2018-03-10 LAB — LIPID PANEL
Cholesterol: 208 mg/dL — ABNORMAL HIGH (ref <200)
HDL: 38 mg/dL — ABNORMAL LOW (ref >50)
LDL Cholesterol (Calc): 134 mg/dL (calc) — ABNORMAL HIGH
NON-HDL CHOLESTEROL (CALC): 170 mg/dL — AB (ref <130)
Total CHOL/HDL Ratio: 5.5 (calc) — ABNORMAL HIGH (ref <5.0)
Triglycerides: 215 mg/dL — ABNORMAL HIGH (ref <150)

## 2018-03-10 LAB — TSH: TSH: 5.07 mIU/L — ABNORMAL HIGH (ref 0.40–4.50)

## 2018-03-10 MED ORDER — DULOXETINE HCL 20 MG PO CPEP: 60.0000 mg | ORAL_CAPSULE | Freq: Every day | ORAL | 1 refills | Status: DC

## 2018-03-10 MED ORDER — BUPROPION HCL ER (SR) 150 MG PO TB12: ORAL_TABLET | ORAL | Status: DC

## 2018-03-10 NOTE — Progress Notes (Signed)
Careteam: Patient Care Team: Lauree Chandler, NP as PCP - General (Geriatric Medicine) Leonie Man, MD as PCP - Cardiology (Cardiology) Stephannie Peters, FNP (Psychiatry)  Advanced Directive information    Allergies  Allergen Reactions  . Bactrim [Sulfamethoxazole-Trimethoprim] Rash  . Sulfa Antibiotics Rash  . Sulfur Rash    Chief Complaint  Patient presents with  . Medical Management of Chronic Issues    Patient here today alone to discuss her anxiety and depression. Patient states everything seems to be going well here recently. She is taking wellbutrin 300 mg in the morning and 150 mg at night. She has added vitamin D to her medication list but unsure how much she is taking. She will call with the dose of vitamin D. No refills needed today.     HPI: Patient is a 58 y.o. female seen in the office today for routine follow up.   Anxiety/depression - mood stable on bupropion SR 300mg  in the am and 150mg  in the PMly and cymbalta 60mg  daily. Following with psychiatrist who is adjusting medication Also does counseling. Had some ups and downs but working closely with psych.     GERD - controlled on omeprazole daily  Tremor - benign essential. Not well controlled, psych adjusting  propanolol  Hx Fe deficiency anemia - she req'd blood transfusion in 1992 due to incomplete placental delivery. She has intermittent restless leg that improves when she takes iron tabs.   OSA - controlled on CPAP qhs. Last sleep study about 4 yrs ago. She does not recall her settings.   Asthma- using albuterol- rarely in the winter will for a few days. Does not need in the summer.   Hyperlipidemia- elevated LDL, has not made many changes, doing a little more exercise.  Does not feel like she can make any more diet changes.   Review of Systems:  Review of Systems  Constitutional: Negative for chills, fever, malaise/fatigue and weight loss.  Respiratory: Negative for cough, sputum  production and shortness of breath.   Cardiovascular: Positive for leg swelling (occasionally). Negative for chest pain and palpitations.  Gastrointestinal: Negative for abdominal pain, constipation, diarrhea and heartburn.  Genitourinary: Negative for dysuria, frequency and urgency.  Musculoskeletal: Negative for back pain, joint pain and myalgias.  Skin: Negative.   Neurological: Positive for tremors. Negative for dizziness and headaches.  Psychiatric/Behavioral: Positive for depression. The patient is nervous/anxious. The patient does not have insomnia.     Past Medical History:  Diagnosis Date  . Anxiety   . Asthma   . Cervix prolapsed into vagina    Uro-Gyne Exam 2011, 2014  . Depression   . GERD (gastroesophageal reflux disease)   . Hearing loss   . High cholesterol   . Overweight   . Sleep apnea   . Tremor    Past Surgical History:  Procedure Laterality Date  . DILATION AND CURETTAGE OF UTERUS  02/24/1990  . MOUTH SURGERY     3 teeth removed   . ROOT CANAL    . ROOT CANAL     2 times   . VAGINAL DELIVERY     1990  . VAGINAL DELIVERY     1986   Social History:   reports that she has never smoked. She has never used smokeless tobacco. She reports current alcohol use. She reports that she does not use drugs.  Family History  Problem Relation Age of Onset  . Hypertension Mother   . High Cholesterol Mother   .  Cataracts Mother   . Retinal detachment Mother   . Hypertension Brother   . High Cholesterol Brother   . Depression Daughter   . Menstrual problems Daughter   . ADD / ADHD Son   . Depression Son   . Heart disease Maternal Uncle   . Diabetes Paternal Aunt   . Diabetes Paternal Grandfather   . Arthritis Paternal Grandfather     Medications: Patient's Medications  New Prescriptions   No medications on file  Previous Medications   ALBUTEROL (PROVENTIL HFA;VENTOLIN HFA) 108 (90 BASE) MCG/ACT INHALER    Inhale 1 puff into the lungs every 6 (six) hours  as needed for wheezing or shortness of breath.   BUPROPION (WELLBUTRIN SR) 150 MG 12 HR TABLET    Take 1 tablet (150 mg total) by mouth 3 (three) times daily.   CHOLECALCIFEROL (VITAMIN D3) 25 MCG (1000 UT) TABLET    Take 1,000 Units by mouth daily.   DULOXETINE (CYMBALTA) 20 MG CAPSULE    Take 2 capsules (40 mg total) by mouth daily.   FERROUS SULFATE 325 (65 FE) MG TABLET    Take 325 mg by mouth. 3-4 nights weekly   FIBER ADULT GUMMIES PO    Take by mouth. Takes half dose daily for constipation   OMEPRAZOLE (PRILOSEC) 20 MG CAPSULE    Take 1 capsule (20 mg total) by mouth daily.   PROPRANOLOL (INDERAL) 60 MG TABLET    Take 60 mg by mouth 2 (two) times daily.  Modified Medications   No medications on file  Discontinued Medications   No medications on file     Physical Exam:  Vitals:   03/10/18 0837  BP: 112/84  Pulse: 66  Temp: 97.9 F (36.6 C)  SpO2: 95%  Weight: 149 lb 3.2 oz (67.7 kg)  Height: 5\' 3"  (1.6 m)   Body mass index is 26.43 kg/m.  Physical Exam Constitutional:      Appearance: Normal appearance. She is well-developed.  HENT:     Head: Normocephalic and atraumatic.  Eyes:     General: No scleral icterus.    Pupils: Pupils are equal, round, and reactive to light.  Neck:     Musculoskeletal: Neck supple.     Thyroid: No thyromegaly.     Vascular: No carotid bruit.     Trachea: No tracheal deviation.  Cardiovascular:     Rate and Rhythm: Normal rate and regular rhythm.     Heart sounds: Normal heart sounds.     Comments: No LE edema b/l. no calf TTP.  Pulmonary:     Effort: Pulmonary effort is normal.     Breath sounds: Normal breath sounds. No stridor.  Abdominal:     General: Bowel sounds are normal.     Palpations: Abdomen is soft. Abdomen is not rigid. There is no hepatomegaly.  Lymphadenopathy:     Cervical: No cervical adenopathy.  Skin:    General: Skin is warm and dry.     Findings: No rash.  Neurological:     Mental Status: She is alert  and oriented to person, place, and time.     Motor: Tremor (resting fine) present.  Psychiatric:        Behavior: Behavior normal.        Thought Content: Thought content normal.        Judgment: Judgment normal.     Labs reviewed: Basic Metabolic Panel: Recent Labs    09/09/17 1110 11/18/17 0815  NA 141  --  K 4.3  --   CL 106  --   CO2 28  --   GLUCOSE 96  --   BUN 12  --   CREATININE 1.01  --   CALCIUM 9.3  --   TSH 5.30* 5.71*   Liver Function Tests: Recent Labs    09/09/17 1110  AST 14  ALT 15  BILITOT 0.5  PROT 6.3   No results for input(s): LIPASE, AMYLASE in the last 8760 hours. No results for input(s): AMMONIA in the last 8760 hours. CBC: Recent Labs    09/09/17 1110  WBC 5.8  NEUTROABS 3,341  HGB 12.6  HCT 37.1  MCV 91.8  PLT 163   Lipid Panel: Recent Labs    09/09/17 1110  CHOL 213*  HDL 35*  LDLCALC 139*  TRIG 240*  CHOLHDL 6.1*   TSH: Recent Labs    09/09/17 1110 11/18/17 0815  TSH 5.30* 5.71*   A1C: No results found for: HGBA1C   Assessment/Plan 1. Elevated TSH - TSH  2. Mixed hyperlipidemia -unable to make much dietary modifications. Willing to start medication if cholesterol worsens.  - Lipid Panel - COMPLETE METABOLIC PANEL WITH GFR  3. Benign essential tremor Unchanged, working with psych to adjust medications. Will continue current regimen at this time.   4. Anxiety and depression -working with psychiatrist and psychologist.  - buPROPion (WELLBUTRIN SR) 150 MG 12 hr tablet; 2 tablets in the morning and 1 tablet in the evening by mouth - DULoxetine (CYMBALTA) 20 MG capsule; Take 3 capsules (60 mg total) by mouth daily.  Dispense: 90 capsule; Refill: 1  5. Perioral dermatitis Occasional flare, uses topical PRN.   6. Gastroesophageal reflux disease without esophagitis Controlled with omeprazole.   7. Iron deficiency anemia, unspecified iron deficiency anemia type -controlled with iron supplement.  - CBC with  Differential/Platelets  Next appt: 6 months, sooner if needed  Cabria Micalizzi K. [place], [facility] [phone]

## 2018-03-10 NOTE — Patient Instructions (Signed)

## 2018-06-20 ENCOUNTER — Encounter: Payer: Self-pay | Admitting: Nurse Practitioner

## 2018-07-06 ENCOUNTER — Telehealth: Payer: Self-pay | Admitting: *Deleted

## 2018-07-06 NOTE — Telephone Encounter (Signed)
Received fax from [facility] [phone] requesting CPAP Supplies for patient. Placed in [place]ine folder to review, fill out and sign.

## 2018-08-06 ENCOUNTER — Ambulatory Visit (INDEPENDENT_AMBULATORY_CARE_PROVIDER_SITE_OTHER): Payer: PRIVATE HEALTH INSURANCE | Admitting: Family Medicine

## 2018-08-06 ENCOUNTER — Encounter: Payer: Self-pay | Admitting: Family Medicine

## 2018-08-06 DIAGNOSIS — G2581 Restless legs syndrome: Secondary | ICD-10-CM | POA: Insufficient documentation

## 2018-08-06 DIAGNOSIS — J4520 Mild intermittent asthma, uncomplicated: Secondary | ICD-10-CM

## 2018-08-06 DIAGNOSIS — G250 Essential tremor: Secondary | ICD-10-CM | POA: Diagnosis not present

## 2018-08-06 DIAGNOSIS — F419 Anxiety disorder, unspecified: Secondary | ICD-10-CM | POA: Diagnosis not present

## 2018-08-06 DIAGNOSIS — G4733 Obstructive sleep apnea (adult) (pediatric): Secondary | ICD-10-CM | POA: Insufficient documentation

## 2018-08-06 DIAGNOSIS — R7989 Other specified abnormal findings of blood chemistry: Secondary | ICD-10-CM | POA: Diagnosis not present

## 2018-08-06 DIAGNOSIS — F329 Major depressive disorder, single episode, unspecified: Secondary | ICD-10-CM

## 2018-08-06 DIAGNOSIS — F32A Anxiety disorder, unspecified: Secondary | ICD-10-CM

## 2018-08-06 DIAGNOSIS — K2100 Gastro-esophageal reflux disease with esophagitis, without bleeding: Secondary | ICD-10-CM

## 2018-08-06 DIAGNOSIS — E039 Hypothyroidism, unspecified: Secondary | ICD-10-CM | POA: Insufficient documentation

## 2018-08-06 DIAGNOSIS — Z9989 Dependence on other enabling machines and devices: Secondary | ICD-10-CM

## 2018-08-06 DIAGNOSIS — K219 Gastro-esophageal reflux disease without esophagitis: Secondary | ICD-10-CM | POA: Insufficient documentation

## 2018-08-06 DIAGNOSIS — E038 Other specified hypothyroidism: Secondary | ICD-10-CM | POA: Insufficient documentation

## 2018-08-06 NOTE — Assessment & Plan Note (Signed)
Stable.  Continue current dose of propranolol.  May have improvement with changing of her antidepressant medications.  Consider trial of Mirapex versus changing dose of propranolol if continues to be problematic.

## 2018-08-06 NOTE — Assessment & Plan Note (Signed)
Stable.  Continue management per psychiatry.  Continue Cymbalta 60 mg daily and Wellbutrin 450 mg daily.

## 2018-08-06 NOTE — Assessment & Plan Note (Signed)
Stable. Continue albuterol as needed.  

## 2018-08-06 NOTE — Assessment & Plan Note (Signed)
Continue supplemental iron every other day.

## 2018-08-06 NOTE — Progress Notes (Signed)
Chief Complaint:  Dominique [name] is a 58 y.o. female who presents today for a virtual office visit with a chief complaint of elevated TSH and to establish care.   Assessment/Plan:  Elevated TSH Check TSH, FT4, and FT3.   Anxiety and depression Stable.  Continue management per psychiatry.  Continue Cymbalta 60 mg daily and Wellbutrin 450 mg daily.  Benign essential tremor Stable.  Continue current dose of propranolol.  May have improvement with changing of her antidepressant medications.  Consider trial of Mirapex versus changing dose of propranolol if continues to be problematic.  Mild intermittent asthma Stable.  Continue albuterol as needed.  GERD (gastroesophageal reflux disease) Stable.  Continue Prilosec 10 mg daily.  Consider increasing dose or switching to alternative PPI if becomes problematic.  Low vitamin D level Continue supplementation with 1000 international units daily.  Restless legs syndrome Continue supplemental iron every other day.  Advised her to come back for her physical in 6 to 7 months when she would normally be due.  Follow-up sooner as needed.    Subjective:  HPI:  She has had slightly elevated TSH last week, she has had blood work done.  She is having some decreased energy levels and is not sure if this is due to her thyroid or some other issue.  Levels were last checked about 6 months ago.  She is never been on thyroid replacement the past.  Her stable, chronic medical conditions are outlined below:   # Depression / Anxiety -Follows with psychiatry  - On cymbalta 60mg  daily and wellbutrin 450mg  daily.  -Tolerating medications okay however is going to be talking to her psychiatrist about possibly switching medications as she is having some tremor and is not sure if this is contributing or not. -ROS: No reported SI or HI.  # Asthma - Uses albuterol as needed -No recent flares.  # GERD -On omeprazole 20 mg daily.  Tolerating well.  # Low  Vitamin D - on vitamin D 1000 international units daily  # Essential Tremor - On propranolol 60mg  twice daily and tolerating well  # Restless Leg Syndrome -On ferrous sulfate 325 mg every other day.  Tolerating well.  Symptoms are reasonably controlled on this.  # OSA on CPAP   % Cervical Prolapse - Follows with OBGYN  ROS: Per HPI, otherwise a complete review of systems was negative.   PMH:  The following were reviewed and entered/updated in epic: Past Medical History:  Diagnosis Date  . Anxiety   . Asthma   . Cervix prolapsed into vagina    Uro-Gyne Exam 2011, 2014  . Depression   . GERD (gastroesophageal reflux disease)   . Hearing loss   . High cholesterol   . Overweight   . Sleep apnea   . Tremor    Patient Active Problem List   Diagnosis Date Noted  . Elevated TSH 08/06/2018  . Mild intermittent asthma 08/06/2018  . GERD (gastroesophageal reflux disease) 08/06/2018  . Low vitamin D level 08/06/2018  . Restless legs syndrome 08/06/2018  . OSA on CPAP 08/06/2018  . Mixed hyperlipidemia 09/09/2017  . Benign essential tremor 09/09/2017  . Anxiety and depression 09/09/2017   Past Surgical History:  Procedure Laterality Date  . DILATION AND CURETTAGE OF UTERUS  02/24/1990  . MOUTH SURGERY     3 teeth removed   . ROOT CANAL    . ROOT CANAL     2 times   . VAGINAL DELIVERY  [place]    Family History  Problem Relation Age of Onset  . Hypertension Mother   . High Cholesterol Mother   . Cataracts Mother   . Retinal detachment Mother   . Hyperlipidemia Mother   . Hypertension Brother   . High Cholesterol Brother   . Depression Daughter   . Menstrual problems Daughter   . ADD / ADHD Son   . Depression Son   . Heart disease Maternal Uncle   . Diabetes Paternal Aunt   . Diabetes Paternal Grandfather   . Arthritis Paternal Grandfather     Medications- reviewed and updated Current Outpatient Medications  Medication Sig  Dispense Refill  . albuterol (PROVENTIL HFA;VENTOLIN HFA) 108 (90 Base) MCG/ACT inhaler Inhale 1 puff into the lungs every 6 (six) hours as needed for wheezing or shortness of breath. (Patient taking differently: Inhale 2 puffs into the lungs every 6 (six) hours as needed for wheezing or shortness of breath. ) 60 g 3  . buPROPion (WELLBUTRIN SR) 150 MG 12 hr tablet 2 tablets in the morning and 1 tablet in the evening by mouth    . cholecalciferol (VITAMIN D3) 25 MCG (1000 UT) tablet Take 1,000 Units by mouth daily.    . DULoxetine (CYMBALTA) 20 MG capsule Take 3 capsules (60 mg total) by mouth daily. 90 capsule 1  . ferrous sulfate 325 (65 FE) MG tablet Take 325 mg by mouth. 3-4 nights weekly    . FIBER ADULT GUMMIES PO Take by mouth. Takes half dose daily for constipation    . omeprazole (PRILOSEC) 20 MG capsule Take 1 capsule (20 mg total) by mouth daily. 90 capsule 3  . propranolol (INDERAL) 60 MG tablet Take 60 mg by mouth 2 (two) times daily.     No current facility-administered medications for this visit.     Allergies-reviewed and updated Allergies  Allergen Reactions  . Bactrim [Sulfamethoxazole-Trimethoprim] Rash  . Sulfa Antibiotics Rash  . Sulfur Rash    Social History   Socioeconomic History  . Marital status: Married    Spouse name: Not on file  . Number of children: Not on file  . Years of education: Not on file  . Highest education level: Not on file  Occupational History  . Not on file  Social Needs  . Financial resource strain: Not on file  . Food insecurity    Worry: Not on file    Inability: Not on file  . Transportation needs    Medical: Not on file    Non-medical: Not on file  Tobacco Use  . Smoking status: Never Smoker  . Smokeless tobacco: Never Used  Substance and Sexual Activity  . Alcohol use: Yes    Comment: 4 drinks per week   . Drug use: No  . Sexual activity: Not Currently  Lifestyle  . Physical activity    Days per week: Not on file     Minutes per session: Not on file  . Stress: Not on file  Relationships  . Social Herbalist on phone: Not on file    Gets together: Not on file    Attends religious service: Not on file    Active member of club or organization: Not on file    Attends meetings of clubs or organizations: Not on file    Relationship status: Not on file  Other Topics Concern  . Not on file  Social History Narrative  As of 11/11/16   Diet: Minimal red meat, lactose intolerance       Caffeine: Yes      Married, if yes what year: Yes, 2006      Do you live in a house, apartment, assisted living, condo, trailer, ect: House, 2 stories, 2 persons      Pets: 1 dog      Current/Past profession: Pediatrician       Exercise: A little, walking dog and elliptical          Living Will: No   DNR: No   POA/HPOA: No      Functional Status:   Do you have difficulty bathing or dressing yourself? No   Do you have difficulty preparing food or eating? No   Do you have difficulty managing your medications? No    Do you have difficulty managing your finances? No    Do you have difficulty affording your medications? No           Objective/Observations  Physical Exam: Gen: NAD, resting comfortably Eyes: Extraocular eye movements intact.  No scleral icterus CV: No peripheral edema Pulm: Normal work of breathing MSK: Upper extremities with full range of motion.  No noted digital cyanosis Skin: No rashes or lesions. Neuro: Grossly normal, moves all extremities Psych: Normal affect and thought content  Virtual Visit via Video   I connected with Eliane Hammersmith on 08/06/18 at  3:00 PM EDT by a video enabled telemedicine application and verified that I am speaking with the correct person using two identifiers. I discussed the limitations of evaluation and management by telemedicine and the availability of in person appointments. The patient expressed understanding and agreed to proceed.   Patient  location: Home Provider location: [place] participating in the virtual visit: Myself and Patient     Algis Greenhouse. [name], MD 08/06/2018 4:56 PM

## 2018-08-06 NOTE — Assessment & Plan Note (Signed)
Check TSH, FT4, and FT3.

## 2018-08-06 NOTE — Assessment & Plan Note (Signed)
Stable.  Continue Prilosec 10 mg daily.  Consider increasing dose or switching to alternative PPI if becomes problematic.

## 2018-08-06 NOTE — Assessment & Plan Note (Signed)
Continue supplementation with 1000 international units daily.

## 2018-10-01 ENCOUNTER — Other Ambulatory Visit: Payer: Self-pay | Admitting: Family Medicine

## 2018-10-01 DIAGNOSIS — Z1231 Encounter for screening mammogram for malignant neoplasm of breast: Secondary | ICD-10-CM

## 2018-10-02 ENCOUNTER — Other Ambulatory Visit: Payer: Self-pay

## 2018-10-02 ENCOUNTER — Encounter: Payer: Self-pay | Admitting: Family Medicine

## 2018-10-02 ENCOUNTER — Ambulatory Visit (INDEPENDENT_AMBULATORY_CARE_PROVIDER_SITE_OTHER): Payer: PRIVATE HEALTH INSURANCE | Admitting: Family Medicine

## 2018-10-02 DIAGNOSIS — T887XXA Unspecified adverse effect of drug or medicament, initial encounter: Secondary | ICD-10-CM | POA: Diagnosis not present

## 2018-10-02 NOTE — Progress Notes (Signed)
Chief Complaint  Patient presents with  . Diarrhea    3 weeks    Dominique [name] is 58 y.o. female here for complaint of diarrhea. Due to COVID-19 pandemic, we are interacting via web portal for an electronic face-to-face visit. I verified patient's ID using 2 identifiers. Patient agreed to proceed with visit via this method. Patient is at home, I am at office. Patient and I are present for visit.   Duration: 4-5 weeks Started after she changed from Cymbalta to Lexapro. Started at 10 mg/d and is currently at 20 mg/d, changed by psych. Had been on Zoloft many years ago without issue that she can remember. Feels the change has worked well.  She has tried increasing yogurt intake.  Taking fiber gummies also. Initially had explosive diarrhea, now is having more freq and soft stools despite being on it for so long.  Abdominal pain? No Bleeding? No Recent travel? No Recent antibiotic use? No Sick contacts? No Fevers? No  ROS:  GI: As noted in HPI Const: Denies fevers  Past Medical History:  Diagnosis Date  . Anxiety   . Asthma   . Cervix prolapsed into vagina    Uro-Gyne Exam 2011, 2014  . Depression   . GERD (gastroesophageal reflux disease)   . Hearing loss   . High cholesterol   . Overweight   . Sleep apnea   . Tremor    Exam No conversational dyspnea Age appropriate judgment and insight Nml affect and mood  Medication side effect - Plan: Take Metamucil 1-2 times daily for next 1-2 weeks. If no improvement, we could consider changing medication. Decided against that today as she is experiencing good success with this current med/dose.   F/u prn. The patient voiced understanding and agreement to the plan.  [place], DO 10/02/18 10:30 AM

## 2018-10-04 ENCOUNTER — Other Ambulatory Visit: Payer: Self-pay | Admitting: Family Medicine

## 2018-10-13 ENCOUNTER — Ambulatory Visit
Admission: RE | Admit: 2018-10-13 | Discharge: 2018-10-13 | Disposition: A | Discharge status: Home / Self Care | Payer: PRIVATE HEALTH INSURANCE | Source: Ambulatory Visit | Attending: Family Medicine | Admitting: Family Medicine

## 2018-10-13 ENCOUNTER — Other Ambulatory Visit: Payer: Self-pay

## 2018-10-13 DIAGNOSIS — Z1231 Encounter for screening mammogram for malignant neoplasm of breast: Secondary | ICD-10-CM

## 2018-10-15 ENCOUNTER — Other Ambulatory Visit: Payer: Self-pay

## 2018-10-15 ENCOUNTER — Other Ambulatory Visit (INDEPENDENT_AMBULATORY_CARE_PROVIDER_SITE_OTHER): Payer: PRIVATE HEALTH INSURANCE

## 2018-10-15 DIAGNOSIS — R7989 Other specified abnormal findings of blood chemistry: Secondary | ICD-10-CM | POA: Diagnosis not present

## 2018-10-15 LAB — T3, FREE: T3, Free: 2.8 pg/mL (ref 2.3–4.2)

## 2018-10-15 LAB — T4, FREE: Free T4: 0.79 ng/dL (ref 0.60–1.60)

## 2018-10-15 LAB — TSH: TSH: 4.6 u[IU]/mL — ABNORMAL HIGH (ref 0.35–4.50)

## 2018-10-15 NOTE — Progress Notes (Signed)
Please inform patient of the following:  Her thyroid levels are borderline - she has subclinical hypothyroidism. Would like for her to schedule an OV soon to discuss whether we should start thyroid replacement or not.  Algis Greenhouse. [name], MD 10/15/2018 1:51 PM

## 2018-10-15 NOTE — Progress Notes (Signed)
TSH is borderline - she has subclinical hypothyroidism.  If she is interested in replacement she can schedule an OV to discuss pros and cons. If she wants to recheck again in 6-12 months, that is ok also.  Algis Greenhouse. [name], MD 10/15/2018 1:52 PM

## 2018-10-19 ENCOUNTER — Other Ambulatory Visit: Payer: Self-pay | Admitting: Family Medicine

## 2018-10-19 DIAGNOSIS — R928 Other abnormal and inconclusive findings on diagnostic imaging of breast: Secondary | ICD-10-CM

## 2018-10-22 ENCOUNTER — Other Ambulatory Visit: Payer: PRIVATE HEALTH INSURANCE

## 2018-10-27 ENCOUNTER — Ambulatory Visit
Admission: RE | Admit: 2018-10-27 | Discharge: 2018-10-27 | Disposition: A | Discharge status: Home / Self Care | Payer: PRIVATE HEALTH INSURANCE | Source: Ambulatory Visit | Attending: Family Medicine | Admitting: Family Medicine

## 2018-10-27 ENCOUNTER — Other Ambulatory Visit: Payer: Self-pay

## 2018-10-27 ENCOUNTER — Other Ambulatory Visit: Payer: Self-pay | Admitting: Family Medicine

## 2018-10-27 DIAGNOSIS — N6310 Unspecified lump in the right breast, unspecified quadrant: Secondary | ICD-10-CM

## 2018-10-27 DIAGNOSIS — R928 Other abnormal and inconclusive findings on diagnostic imaging of breast: Secondary | ICD-10-CM

## 2018-10-29 ENCOUNTER — Other Ambulatory Visit: Payer: Self-pay | Admitting: Family Medicine

## 2018-10-29 ENCOUNTER — Other Ambulatory Visit: Payer: Self-pay

## 2018-10-29 ENCOUNTER — Ambulatory Visit
Admission: RE | Admit: 2018-10-29 | Discharge: 2018-10-29 | Disposition: A | Discharge status: Home / Self Care | Payer: PRIVATE HEALTH INSURANCE | Source: Ambulatory Visit | Attending: Family Medicine | Admitting: Family Medicine

## 2018-10-29 ENCOUNTER — Other Ambulatory Visit: Payer: Self-pay | Admitting: Diagnostic Radiology

## 2018-10-29 DIAGNOSIS — N6310 Unspecified lump in the right breast, unspecified quadrant: Secondary | ICD-10-CM

## 2018-11-17 ENCOUNTER — Other Ambulatory Visit: Payer: Self-pay

## 2018-11-17 ENCOUNTER — Encounter: Payer: Self-pay | Admitting: Family Medicine

## 2018-11-17 ENCOUNTER — Ambulatory Visit: Payer: PRIVATE HEALTH INSURANCE | Admitting: Family Medicine

## 2018-11-17 VITALS — BP 126/78 | HR 54 | Temp 97.3°F | Ht 63.0 in | Wt 156.0 lb

## 2018-11-17 DIAGNOSIS — R7989 Other specified abnormal findings of blood chemistry: Secondary | ICD-10-CM | POA: Diagnosis not present

## 2018-11-17 DIAGNOSIS — Z23 Encounter for immunization: Secondary | ICD-10-CM | POA: Diagnosis not present

## 2018-11-17 DIAGNOSIS — E038 Other specified hypothyroidism: Secondary | ICD-10-CM

## 2018-11-17 DIAGNOSIS — E039 Hypothyroidism, unspecified: Secondary | ICD-10-CM

## 2018-11-17 DIAGNOSIS — G250 Essential tremor: Secondary | ICD-10-CM | POA: Diagnosis not present

## 2018-11-17 DIAGNOSIS — F419 Anxiety disorder, unspecified: Secondary | ICD-10-CM

## 2018-11-17 DIAGNOSIS — F32A Anxiety disorder, unspecified: Secondary | ICD-10-CM

## 2018-11-17 DIAGNOSIS — F329 Major depressive disorder, single episode, unspecified: Secondary | ICD-10-CM

## 2018-11-17 MED ORDER — LEVOTHYROXINE SODIUM 25 MCG PO TABS: 25.0000 ug | ORAL_TABLET | Freq: Every day | ORAL | 3 refills | Status: DC

## 2018-11-17 NOTE — Progress Notes (Signed)
   Chief Complaint:  Dominique [name] is a 58 y.o. female who presents today with a chief complaint of elevated TSH.   Assessment/Plan:  Subclinical hypothyroidism Patient with symptomatic subclinical hypothyroidism.  Discussed risks and benefits of starting Synthroid.  We will start Synthroid 25 mcg daily.  Discussed potential side effects.  She will check in with me in a couple of weeks.  She will come back in 4 to 6 weeks to recheck TSH.  Anxiety and depression Continue management per psychiatry.  Hopefully have some improvement in her mood with starting Synthroid.  We will continue Wellbutrin 300 mg daily and Lexapro 20 milligrams daily as per psychiatry.  Benign essential tremor Continue propranolol 60 mg daily.  Will place referral to neurology per patient request.  Flu vaccine was given today.     Subjective:  HPI:  Patient has had elevated TSH for several years.  She thinks this is been going on for 8 to 10 years.  She has never been on treatment for this.  She also has difficulties with anxiety and depression and decreased energy levels.  She is currently seeing a psychiatrist for her anxiety and depression.  She is started on Lexapro 20 mg daily a couple of months ago.  She has not noticed significant difference with this.  She is having some diarrhea due to this.  Wellbutrin was decreased to 300 mg daily due to worsening her essential tremor.  Does not have any issues with constipation.  No skin or hair changes.  ROS: Per HPI  PMH: She reports that she has never smoked. She has never used smokeless tobacco. She reports current alcohol use. She reports that she does not use drugs.      Objective:  Physical Exam: BP 126/78   Pulse (!) 54   Temp (!) 97.3 F (36.3 C)   Ht 5\' 3"  (1.6 m)   Wt 156 lb (70.8 kg)   SpO2 97%   BMI 27.63 kg/m   Gen: NAD, resting comfortably Neuro: Grossly normal, moves all extremities Psych: Normal affect and thought content      Taryn Nave M.  [name], MD 11/17/2018 9:13 AM

## 2018-11-17 NOTE — Assessment & Plan Note (Signed)
Continue management per psychiatry.  Hopefully have some improvement in her mood with starting Synthroid.  We will continue Wellbutrin 300 mg daily and Lexapro 20 milligrams daily as per psychiatry.

## 2018-11-17 NOTE — Patient Instructions (Signed)
It was very nice to see you today!  We will start thyroid replacement.  Hopefully this will help some with your mood and energy levels.  Please check in with me in a couple of weeks to let me know how you are doing.  You can give me a call or send a message in my chart.  Please come back in 4 to 6-week so that we can recheck your TSH.  Take care, Dr [name]  Please try these tips to maintain a healthy lifestyle:   Eat at least 3 REAL meals and 1-2 snacks per day.  Aim for no more than 5 hours between eating.  If you eat breakfast, please do so within one hour of getting up.    Obtain twice as many fruits/vegetables as protein or carbohydrate foods for both lunch and dinner. (Half of each meal should be fruits/vegetables, one quarter protein, and one quarter starchy carbs)   Cut down on sweet beverages. This includes juice, soda, and sweet tea.    Exercise at least 150 minutes every week.

## 2018-11-17 NOTE — Assessment & Plan Note (Signed)
Continue propranolol 60 mg daily.  Will place referral to neurology per patient request.

## 2018-11-17 NOTE — Assessment & Plan Note (Signed)
Patient with symptomatic subclinical hypothyroidism.  Discussed risks and benefits of starting Synthroid.  We will start Synthroid 25 mcg daily.  Discussed potential side effects.  She will check in with me in a couple of weeks.  She will come back in 4 to 6 weeks to recheck TSH.

## 2018-11-24 ENCOUNTER — Encounter: Payer: Self-pay | Admitting: Neurology

## 2018-12-14 ENCOUNTER — Encounter: Payer: Self-pay | Admitting: Family Medicine

## 2018-12-14 ENCOUNTER — Ambulatory Visit (INDEPENDENT_AMBULATORY_CARE_PROVIDER_SITE_OTHER): Payer: PRIVATE HEALTH INSURANCE | Admitting: Family Medicine

## 2018-12-14 ENCOUNTER — Other Ambulatory Visit: Payer: Self-pay | Admitting: Family Medicine

## 2018-12-14 DIAGNOSIS — E039 Hypothyroidism, unspecified: Secondary | ICD-10-CM

## 2018-12-14 DIAGNOSIS — F419 Anxiety disorder, unspecified: Secondary | ICD-10-CM

## 2018-12-14 DIAGNOSIS — E038 Other specified hypothyroidism: Secondary | ICD-10-CM

## 2018-12-14 DIAGNOSIS — F325 Major depressive disorder, single episode, in full remission: Secondary | ICD-10-CM | POA: Diagnosis not present

## 2018-12-14 DIAGNOSIS — Z800 Family history of malignant neoplasm of digestive organs: Secondary | ICD-10-CM

## 2018-12-14 DIAGNOSIS — K219 Gastro-esophageal reflux disease without esophagitis: Secondary | ICD-10-CM

## 2018-12-14 NOTE — Assessment & Plan Note (Signed)
Chronic problem.  Slightly worsened.  Continue Wellbutrin 3 mg daily and Lexapro 20 mg daily per psychiatry.  She will continue seeing her therapist as well.

## 2018-12-14 NOTE — Assessment & Plan Note (Signed)
Chronic problem.  Slightly worsened.  Feels like things are currently manageable.  We will continue Wellbutrin 300 mg daily and Lexapro 20 mg daily.  Continue management per psychiatry and therapy.

## 2018-12-14 NOTE — Assessment & Plan Note (Signed)
Chronic problem.  Stable.  Continue Synthroid 25 mcg daily.  She will come back in a couple of weeks.  Check TSH at that time.

## 2018-12-14 NOTE — Progress Notes (Signed)
    Chief Complaint:  Dominique [name] is a 58 y.o. female who presents today for a virtual office visit with a chief complaint of stress.   Assessment/Plan:  Family history of colon cancer Will place referral to GI for discussed early screening.  Last colonoscopy 7 years ago which was reportedly normal.  Subclinical hypothyroidism Chronic problem.  Stable.  Continue Synthroid 25 mcg daily.  She will come back in a couple of weeks.  Check TSH at that time.  Anxiety Chronic problem.  Slightly worsened.  Continue Wellbutrin 3 mg daily and Lexapro 20 mg daily per psychiatry.  She will continue seeing her therapist as well.  Depression, major, single episode, complete remission (HCC) Chronic problem.  Slightly worsened.  Feels like things are currently manageable.  We will continue Wellbutrin 300 mg daily and Lexapro 20 mg daily.  Continue management per psychiatry and therapy.     Subjective:  HPI:  Patient found out about a month ago that her mother has stage IV colon cancer.  Mother currently lives in [place].  Patient has stated this is a large source of stress for the last several weeks.  She had her colonoscopy when she was 58 years old which was reportedly normal without any polyps or other abnormalities.    #Anxiety/depression Follows with psychiatry and therapy.  On Wellbutrin 300 mg daily and Lexapro 20 mg daily.  She has noticed increased depression and stress recently due to her mother's diagnosis.  She thinks she is handling things as well as can recently be done.  She notes medications are still working well for her.  Does not want make any changes.  No reported SI or HI.  #Subclinical hyperthyroidism - Started on levothyroxine 25 mcg daily about a month ago.  Has been tolerating well without any side effects.  ROS: Per HPI  PMH: She reports that she has never smoked. She has never used smokeless tobacco. She reports current alcohol use. She reports that she does not  use drugs.      Objective/Observations  Physical Exam: Gen: NAD, resting comfortably Pulm: Normal work of breathing Neuro: Grossly normal, moves all extremities Psych: Normal affect and thought content  No results found for this or any previous visit (from the past 24 hour(s)).   Virtual Visit via Video   I connected with Dominique [name] on 12/14/18 at  3:00 PM EDT by a video enabled telemedicine application and verified that I am speaking with the correct person using two identifiers. I discussed the limitations of evaluation and management by telemedicine and the availability of in person appointments. The patient expressed understanding and agreed to proceed.   Patient location: Home Provider location: [place] participating in the virtual visit: Myself and Patient     Algis Greenhouse. [name], MD 12/14/2018 3:19 PM

## 2019-01-12 ENCOUNTER — Other Ambulatory Visit: Payer: Self-pay

## 2019-01-12 ENCOUNTER — Telehealth: Payer: Self-pay | Admitting: Family Medicine

## 2019-01-12 DIAGNOSIS — Z20822 Contact with and (suspected) exposure to covid-19: Secondary | ICD-10-CM

## 2019-01-12 NOTE — Telephone Encounter (Signed)
See note  Copied from [place] [id number]. Topic: General - Inquiry >> Jan 12, 2019  4:39 PM Alanda Slim E wrote: Reason for CRM: Pt would like to add a Lyme disease test to her labs tomorrow. Please advise

## 2019-01-13 ENCOUNTER — Other Ambulatory Visit: Payer: Self-pay

## 2019-01-13 ENCOUNTER — Other Ambulatory Visit (INDEPENDENT_AMBULATORY_CARE_PROVIDER_SITE_OTHER): Payer: BC Managed Care – PPO

## 2019-01-13 DIAGNOSIS — E038 Other specified hypothyroidism: Secondary | ICD-10-CM

## 2019-01-13 DIAGNOSIS — E039 Hypothyroidism, unspecified: Secondary | ICD-10-CM | POA: Diagnosis not present

## 2019-01-13 LAB — NOVEL CORONAVIRUS, NAA: SARS-CoV-2, NAA: NOT DETECTED

## 2019-01-13 LAB — TSH: TSH: 2.36 u[IU]/mL (ref 0.35–4.50)

## 2019-01-13 NOTE — Telephone Encounter (Signed)
Please advise 

## 2019-01-13 NOTE — Telephone Encounter (Signed)
Patient offered to schedule labs,she will call when she is ready to have labs drawn.

## 2019-01-13 NOTE — Telephone Encounter (Signed)
Looks like she already had labs drawn - please check with lab if anything else can be added on.  Algis Greenhouse. [name], MD 01/13/2019 4:09 PM

## 2019-01-14 NOTE — Progress Notes (Signed)
Please inform patient of the following:  Thyroid level is back to normal. Would like for her to continue her current dose and we can recheck next year.  Dominique [name]. [name], MD 01/14/2019 9:52 AM

## 2019-01-19 ENCOUNTER — Telehealth: Payer: Self-pay | Admitting: Family Medicine

## 2019-01-19 NOTE — Telephone Encounter (Signed)
Pt is waiting on mail order and needs propranolol 60 mg a 2 wk supply #[facility]  [address]

## 2019-01-19 NOTE — Telephone Encounter (Signed)
See note

## 2019-01-24 ENCOUNTER — Other Ambulatory Visit: Payer: Self-pay | Admitting: Family Medicine

## 2019-01-24 ENCOUNTER — Encounter: Payer: Self-pay | Admitting: Family Medicine

## 2019-01-24 ENCOUNTER — Other Ambulatory Visit: Payer: Self-pay

## 2019-01-24 MED ORDER — PROPRANOLOL HCL 60 MG PO TABS: 60.0000 mg | ORAL_TABLET | Freq: Two times a day (BID) | ORAL | 0 refills | Status: DC

## 2019-01-24 MED ORDER — ESCITALOPRAM OXALATE 20 MG PO TABS: 20.0000 mg | ORAL_TABLET | Freq: Every day | ORAL | 2 refills | Status: DC

## 2019-01-25 ENCOUNTER — Other Ambulatory Visit: Payer: Self-pay

## 2019-01-25 MED ORDER — PROPRANOLOL HCL 60 MG PO TABS: 60.0000 mg | ORAL_TABLET | Freq: Two times a day (BID) | ORAL | 0 refills | Status: DC

## 2019-01-25 NOTE — Telephone Encounter (Signed)
Rx sent to pharmacy for short supply.

## 2019-01-27 NOTE — Progress Notes (Signed)
Subjective:   Dominique [name] was seen in consultation in the movement disorder clinic at the request of Vivi Barrack, MD.  Pt is a 58 y/o female with a hx of anxiety/depression/hypothyroidism who presents for the evaluation of tremor.  Tremor started approximately 8 years ago and involves the bilateral UE and occasional the head.  The R hand shakes more than the L.  She is R hand dominant.  Tremor is most noticeable when eating.   There is no family hx of tremor.    Affected by caffeine:  No. (3 sodas per day - diet dr peppers per day) Affected by alcohol:  No. Affected by stress:  Yes.   Affected by fatigue:  Yes.   Spills soup if on spoon:  Yes.   (will do well with the propranolol but without it, she would spill it).  Affects ADL's (tying shoes, brushing teeth, etc):  No.  Current/Previously tried tremor medications: on propranolol 60 mg, 2 po q day (its helpful.  No SE.  Saw neuro in [place] who started her on that.  Been on it for 2-3 years ago.  Started it at 40 mg.  Psychiatry increased it)  Current medications that may exacerbate tremor:  Albuterol (only uses few times per month and doesn't know if increases tremor)  Outside reports reviewed: historical medical records, lab reports, office notes and referral letter/letters.  Neuroimaging has not been performed here but she did have some done at [facility] for hearing loss.  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  Allergies  Allergen Reactions  . Bactrim [Sulfamethoxazole-Trimethoprim] Rash  . Sulfa Antibiotics Rash  . Sulfur Rash    Current Outpatient Medications  Medication Instructions  . albuterol (PROVENTIL HFA;VENTOLIN HFA) 108 (90 Base) MCG/ACT inhaler 1 puff, Inhalation, Every 6 hours PRN  . buPROPion (WELLBUTRIN XL) 300 mg, Oral, Daily  . escitalopram (LEXAPRO) 20 mg, Oral, Daily  . ferrous sulfate 325 mg, Oral, 3-4 nights weekly   . levothyroxine (SYNTHROID) 25 mcg, Oral, Daily before breakfast  .  omeprazole (PRILOSEC) 20 MG capsule TAKE 1 CAPSULE BY MOUTH DAILY.  Marland Kitchen propranolol (INDERAL) 60 mg, Oral, 2 times daily  . psyllium (METAMUCIL) 58.6 % packet 1 packet, Oral, Daily    Past Medical History:  Diagnosis Date  . Anxiety   . Asthma   . Cervix prolapsed into vagina    Uro-Gyne Exam 2011, 2014  . Depression   . GERD (gastroesophageal reflux disease)   . Hearing loss   . High cholesterol   . Sleep apnea    faithful with cpap  . Tremor     Past Surgical History:  Procedure Laterality Date  . DILATION AND CURETTAGE OF UTERUS  02/24/1990  . MOUTH SURGERY     3 teeth removed   . ROOT CANAL    . ROOT CANAL     2 times   . VAGINAL DELIVERY     1990  . VAGINAL DELIVERY     1986    Social History   Socioeconomic History  . Marital status: Married    Spouse name: Not on file  . Number of children: 3  . Years of education: Not on file  . Highest education level: Not on file  Occupational History  . Occupation: retired    Comment: pediatrician  Social Needs  . Financial resource strain: Not on file  . Food insecurity    Worry: Not on file    Inability:  Not on file  . Transportation needs    Medical: Not on file    Non-medical: Not on file  Tobacco Use  . Smoking status: Never Smoker  . Smokeless tobacco: Never Used  Substance and Sexual Activity  . Alcohol use: Yes  . Drug use: No  . Sexual activity: Not Currently  Lifestyle  . Physical activity    Days per week: Not on file    Minutes per session: Not on file  . Stress: Not on file  Relationships  . Social Herbalist on phone: Not on file    Gets together: Not on file    Attends religious service: Not on file    Active member of club or organization: Not on file    Attends meetings of clubs or organizations: Not on file    Relationship status: Not on file  . Intimate partner violence    Fear of current or ex partner: Not on file    Emotionally abused: Not on file    Physically abused:  Not on file    Forced sexual activity: Not on file  Other Topics Concern  . Not on file  Social History Narrative   As of 11/11/16   Diet: Minimal red meat, lactose intolerance       Caffeine: Yes      Married, if yes what year: Yes, 2006      Do you live in a house, apartment, assisted living, condo, trailer, ect: House, 2 stories, 2 persons      Pets: 1 dog      Current/Past profession: Pediatrician       Exercise: A little, walking dog and elliptical          Living Will: No   DNR: No   POA/HPOA: No      Functional Status:   Do you have difficulty bathing or dressing yourself? No   Do you have difficulty preparing food or eating? No   Do you have difficulty managing your medications? No    Do you have difficulty managing your finances? No    Do you have difficulty affording your medications? No       Right handed   Lives in two story home with husband and dog    Family Status  Relation Name Status  . Mother Gust Rung  . Father Trish Mage  . Sister Meyer Russel  . Brother [employer]  . Daughter Patent attorney  . Daughter [employer]  . Son Tama Gander  . Mat Uncle  (Not Specified)  . Ethlyn Daniels  (Not Specified)  . PGF  (Not Specified)  . Cousin  (Not Specified)    Review of Systems Review of Systems  Constitutional: Negative.   HENT:       Occasional swallowing trouble  Eyes: Negative.   Respiratory: Negative.   Cardiovascular: Negative.   Gastrointestinal: Negative.   Genitourinary: Negative.   Musculoskeletal: Negative.   Skin: Negative.   Endo/Heme/Allergies: Negative.   Psychiatric/Behavioral: Positive for depression. The patient is nervous/anxious.      Objective:   VITALS:   Vitals:   01/28/19 0936  BP: 112/68  Pulse: 62  Weight: 158 lb (71.7 kg)  Height: 5\' 3"  (1.6 m)   Gen:  Appears stated age and in NAD. HEENT:  Normocephalic, atraumatic. The mucous membranes are moist. The superficial temporal arteries are without ropiness or  tenderness. Cardiovascular: Regular rate and rhythm. Lungs: Clear to auscultation bilaterally. Neck:  There are no carotid bruits noted bilaterally.  NEUROLOGICAL:  Orientation:  The patient is alert and oriented x 3.   Attention span and concentration are normal.   Fund of knowledge is appropriate Cranial nerves: There is good facial symmetry.  Extraocular muscles are intact and visual fields are full to confrontational testing. Speech is fluent and clear. Soft palate rises symmetrically and there is no tongue deviation. Hearing is intact to conversational tone. Tone: Tone is good throughout. Sensation: Sensation is intact to light touch  throughout (facial, trunk, extremities). Vibration is intact at the bilateral big toe. There is no extinction with double simultaneous stimulation. There is no sensory dermatomal level identified. Coordination:  The patient has no dysdiadichokinesia or dysmetria. Motor: Strength is 5/5 in the bilateral upper and lower extremities.  Shoulder shrug is equal bilaterally.  There is no pronator drift.  There are no fasciculations noted. DTR's: Deep tendon reflexes are 2+/4 at the bilateral biceps, triceps, brachioradialis, 3/4 at the bilateral patella and achilles.  Plantar responses are downgoing bilaterally. Gait and Station: The patient is able to ambulate without difficulty. The patient is able to heel toe walk without any difficulty. The patient is able to ambulate in a tandem fashion. The patient is able to stand in the Romberg position.   MOVEMENT EXAM: Tremor:  There is no resting tremor.  There is a postural tremor.  It is worse when given a weight, especially when the arm is held in a proximal position (wing beating position).  Tremor is evident with Archimedes spirals, but it is not bad.  She has tremor when pouring water from 1 glass to another, especially when the full glasses in the right hand.  She does not spill a significant degree of water.   Lab  Results  Component Value Date   TSH 2.36 01/13/2019     Chemistry      Component Value Date/Time   NA 142 03/10/2018 0907   K 4.3 03/10/2018 0907   CL 109 03/10/2018 0907   CO2 28 03/10/2018 0907   BUN 12 03/10/2018 0907   CREATININE 0.98 03/10/2018 0907      Component Value Date/Time   CALCIUM 9.2 03/10/2018 0907   AST 21 03/10/2018 0907   ALT 20 03/10/2018 0907   BILITOT 0.6 03/10/2018 0907          Assessment/Plan:   1.  Essential Tremor.  - We discussed nature and pathophysiology.  We discussed that this can continue to gradually get worse with time.  We discussed that some medications can worsen this, as can caffeine use.  We discussed medication therapy as well as surgical therapy.  Ultimately, the patient is happy with her propranolol, 60 mg, 2 tablets daily.  We did discuss that propranolol can worsen depression, but she states that her psychiatrist had actually been the one to increase it.  We also discussed options like primidone.  Right now, she is fairly happy with the degree of tremor control.  I do not think she needs surgery, but we did discuss this option.  2.  Anxiety and depression  -Following with psychiatry.  On Wellbutrin and Lexapro  3.  Patient would like to follow-up in 1 year.  She will let me know if she needs me before that time.  CC:  Vivi Barrack, MD

## 2019-01-28 ENCOUNTER — Encounter: Payer: Self-pay | Admitting: Neurology

## 2019-01-28 ENCOUNTER — Other Ambulatory Visit: Payer: Self-pay

## 2019-01-28 ENCOUNTER — Ambulatory Visit: Payer: BC Managed Care – PPO | Admitting: Neurology

## 2019-01-28 ENCOUNTER — Encounter: Payer: Self-pay | Admitting: Family Medicine

## 2019-01-28 VITALS — BP 112/68 | HR 62 | Ht 63.0 in | Wt 158.0 lb

## 2019-01-28 DIAGNOSIS — M1990 Unspecified osteoarthritis, unspecified site: Secondary | ICD-10-CM | POA: Insufficient documentation

## 2019-01-28 DIAGNOSIS — G250 Essential tremor: Secondary | ICD-10-CM

## 2019-01-28 DIAGNOSIS — F330 Major depressive disorder, recurrent, mild: Secondary | ICD-10-CM

## 2019-01-28 DIAGNOSIS — F329 Major depressive disorder, single episode, unspecified: Secondary | ICD-10-CM | POA: Insufficient documentation

## 2019-01-28 DIAGNOSIS — F32A Depression, unspecified: Secondary | ICD-10-CM | POA: Insufficient documentation

## 2019-01-28 DIAGNOSIS — J45909 Unspecified asthma, uncomplicated: Secondary | ICD-10-CM | POA: Insufficient documentation

## 2019-01-28 NOTE — Patient Instructions (Signed)
The physicians and staff at [facility] are committed to providing excellent care. You may receive a survey requesting feedback about your experience at our office. We strive to receive "very good" responses to the survey questions. If you feel that your experience would prevent you from giving the office a "very good " response, please contact our office to try to remedy the situation. We may be reached at [phone]. Thank you for taking the time out of your busy day to complete the survey.  

## 2019-02-09 ENCOUNTER — Encounter: Payer: Self-pay | Admitting: Family Medicine

## 2019-02-11 ENCOUNTER — Other Ambulatory Visit: Payer: Self-pay

## 2019-02-11 DIAGNOSIS — K219 Gastro-esophageal reflux disease without esophagitis: Secondary | ICD-10-CM

## 2019-02-11 MED ORDER — OMEPRAZOLE 20 MG PO CPDR: 20.0000 mg | DELAYED_RELEASE_CAPSULE | Freq: Every day | ORAL | 3 refills | Status: DC

## 2019-02-11 MED ORDER — LEVOTHYROXINE SODIUM 25 MCG PO TABS: 25.0000 ug | ORAL_TABLET | Freq: Every day | ORAL | 1 refills | Status: DC

## 2019-02-11 MED ORDER — ESCITALOPRAM OXALATE 20 MG PO TABS: 20.0000 mg | ORAL_TABLET | Freq: Every day | ORAL | 1 refills | Status: DC

## 2019-02-11 MED ORDER — BUPROPION HCL ER (XL) 300 MG PO TB24: 300.0000 mg | ORAL_TABLET | Freq: Every day | ORAL | 1 refills | Status: DC

## 2019-02-11 MED ORDER — PROPRANOLOL HCL 60 MG PO TABS: 60.0000 mg | ORAL_TABLET | Freq: Two times a day (BID) | ORAL | 1 refills | Status: DC

## 2019-02-23 ENCOUNTER — Other Ambulatory Visit: Payer: Self-pay

## 2019-02-23 ENCOUNTER — Encounter: Payer: Self-pay | Admitting: Family Medicine

## 2019-02-23 MED ORDER — LEVOTHYROXINE SODIUM 25 MCG PO TABS: 25.0000 ug | ORAL_TABLET | Freq: Every day | ORAL | 0 refills | Status: DC

## 2019-02-24 ENCOUNTER — Other Ambulatory Visit: Payer: Self-pay | Admitting: Family Medicine

## 2019-02-24 DIAGNOSIS — R5383 Other fatigue: Secondary | ICD-10-CM

## 2019-03-14 ENCOUNTER — Ambulatory Visit: Payer: BC Managed Care – PPO | Attending: Internal Medicine

## 2019-03-14 DIAGNOSIS — Z20822 Contact with and (suspected) exposure to covid-19: Secondary | ICD-10-CM

## 2019-03-15 LAB — NOVEL CORONAVIRUS, NAA: SARS-CoV-2, NAA: NOT DETECTED

## 2019-05-27 ENCOUNTER — Ambulatory Visit: Payer: BC Managed Care – PPO | Attending: Internal Medicine

## 2019-05-27 DIAGNOSIS — Z23 Encounter for immunization: Secondary | ICD-10-CM

## 2019-05-27 NOTE — Progress Notes (Signed)
   Covid-19 Vaccination Clinic  Name:  Dominique [name]    MRN: [id number] DOB: 05-01-1960  05/27/2019  [name] was observed post Covid-19 immunization for 15 minutes without incident. She was provided with Vaccine Information Sheet and instruction to access the V-Safe system.   [name] was instructed to call 911 with any severe reactions post vaccine: Marland Kitchen Difficulty breathing  . Swelling of face and throat  . A fast heartbeat  . A bad rash all over body  . Dizziness and weakness   Immunizations Administered    Name Date Dose VIS Date Route   Pfizer COVID-19 Vaccine 05/27/2019  1:10 PM 0.3 mL 02/04/2019 Intramuscular   Manufacturer: [place]   Lot: [id number]   [place]: [id number]

## 2019-06-20 ENCOUNTER — Ambulatory Visit: Payer: BC Managed Care – PPO | Attending: Internal Medicine

## 2019-06-20 DIAGNOSIS — Z23 Encounter for immunization: Secondary | ICD-10-CM

## 2019-06-20 NOTE — Progress Notes (Signed)
   Covid-19 Vaccination Clinic  Name:  Lempi Falsetti    MRN: [id number] DOB: 07-05-1960  06/20/2019  Ms. Malcomb was observed post Covid-19 immunization for 15 minutes without incident. She was provided with Vaccine Information Sheet and instruction to access the V-Safe system.   Ms. Leatherwood was instructed to call 911 with any severe reactions post vaccine: Marland Kitchen Difficulty breathing  . Swelling of face and throat  . A fast heartbeat  . A bad rash all over body  . Dizziness and weakness   Immunizations Administered    Name Date Dose VIS Date Route   Pfizer COVID-19 Vaccine 06/20/2019  1:45 PM 0.3 mL 04/20/2018 Intramuscular   Manufacturer: [place]   Lot: [id number]   [place]: [id number]

## 2019-07-14 ENCOUNTER — Other Ambulatory Visit: Payer: Self-pay | Admitting: Family Medicine

## 2019-07-15 NOTE — Telephone Encounter (Signed)
Pt requesting refills.

## 2019-07-15 NOTE — Telephone Encounter (Signed)
Ok with me. Please place any necessary orders. 

## 2019-08-13 ENCOUNTER — Emergency Department (HOSPITAL_COMMUNITY): Payer: BC Managed Care – PPO

## 2019-08-13 ENCOUNTER — Observation Stay (HOSPITAL_COMMUNITY)
Admission: EM | Admit: 2019-08-13 | Discharge: 2019-08-15 | Disposition: A | Discharge status: Home / Self Care | Payer: BC Managed Care – PPO | Attending: General Surgery | Admitting: General Surgery

## 2019-08-13 ENCOUNTER — Encounter (HOSPITAL_COMMUNITY): Payer: Self-pay | Admitting: Emergency Medicine

## 2019-08-13 DIAGNOSIS — G4733 Obstructive sleep apnea (adult) (pediatric): Secondary | ICD-10-CM | POA: Insufficient documentation

## 2019-08-13 DIAGNOSIS — E038 Other specified hypothyroidism: Secondary | ICD-10-CM | POA: Insufficient documentation

## 2019-08-13 DIAGNOSIS — F329 Major depressive disorder, single episode, unspecified: Secondary | ICD-10-CM | POA: Insufficient documentation

## 2019-08-13 DIAGNOSIS — Z79890 Hormone replacement therapy (postmenopausal): Secondary | ICD-10-CM | POA: Insufficient documentation

## 2019-08-13 DIAGNOSIS — E782 Mixed hyperlipidemia: Secondary | ICD-10-CM | POA: Diagnosis not present

## 2019-08-13 DIAGNOSIS — G2581 Restless legs syndrome: Secondary | ICD-10-CM | POA: Diagnosis not present

## 2019-08-13 DIAGNOSIS — Z20822 Contact with and (suspected) exposure to covid-19: Secondary | ICD-10-CM | POA: Diagnosis not present

## 2019-08-13 DIAGNOSIS — E7800 Pure hypercholesterolemia, unspecified: Secondary | ICD-10-CM | POA: Diagnosis not present

## 2019-08-13 DIAGNOSIS — K8012 Calculus of gallbladder with acute and chronic cholecystitis without obstruction: Secondary | ICD-10-CM | POA: Diagnosis not present

## 2019-08-13 DIAGNOSIS — K8020 Calculus of gallbladder without cholecystitis without obstruction: Secondary | ICD-10-CM

## 2019-08-13 DIAGNOSIS — K219 Gastro-esophageal reflux disease without esophagitis: Secondary | ICD-10-CM | POA: Diagnosis not present

## 2019-08-13 DIAGNOSIS — K8000 Calculus of gallbladder with acute cholecystitis without obstruction: Secondary | ICD-10-CM | POA: Diagnosis present

## 2019-08-13 DIAGNOSIS — Z79899 Other long term (current) drug therapy: Secondary | ICD-10-CM | POA: Insufficient documentation

## 2019-08-13 DIAGNOSIS — F419 Anxiety disorder, unspecified: Secondary | ICD-10-CM | POA: Diagnosis not present

## 2019-08-13 DIAGNOSIS — R1011 Right upper quadrant pain: Secondary | ICD-10-CM

## 2019-08-13 DIAGNOSIS — J4520 Mild intermittent asthma, uncomplicated: Secondary | ICD-10-CM | POA: Insufficient documentation

## 2019-08-13 LAB — CBC
HCT: 40 % (ref 36.0–46.0)
Hemoglobin: 13.5 g/dL (ref 12.0–15.0)
MCH: 31.7 pg (ref 26.0–34.0)
MCHC: 33.8 g/dL (ref 30.0–36.0)
MCV: 93.9 fL (ref 80.0–100.0)
Platelets: 165 10*3/uL (ref 150–400)
RBC: 4.26 MIL/uL (ref 3.87–5.11)
RDW: 12.4 % (ref 11.5–15.5)
WBC: 7.4 10*3/uL (ref 4.0–10.5)
nRBC: 0 % (ref 0.0–0.2)

## 2019-08-13 LAB — URINALYSIS, ROUTINE W REFLEX MICROSCOPIC
Bacteria, UA: NONE SEEN
Bilirubin Urine: NEGATIVE
Glucose, UA: NEGATIVE mg/dL
Hgb urine dipstick: NEGATIVE
Ketones, ur: NEGATIVE mg/dL
Nitrite: NEGATIVE
Protein, ur: NEGATIVE mg/dL
Specific Gravity, Urine: 1.013 (ref 1.005–1.030)
pH: 7 (ref 5.0–8.0)

## 2019-08-13 LAB — COMPREHENSIVE METABOLIC PANEL
ALT: 16 U/L (ref 0–44)
AST: 15 U/L (ref 15–41)
Albumin: 3.7 g/dL (ref 3.5–5.0)
Alkaline Phosphatase: 82 U/L (ref 38–126)
Anion gap: 9 (ref 5–15)
BUN: 13 mg/dL (ref 6–20)
CO2: 21 mmol/L — ABNORMAL LOW (ref 22–32)
Calcium: 9.1 mg/dL (ref 8.9–10.3)
Chloride: 109 mmol/L (ref 98–111)
Creatinine, Ser: 0.96 mg/dL (ref 0.44–1.00)
GFR calc Af Amer: >60 mL/min (ref >60)
GFR calc non Af Amer: >60 mL/min (ref >60)
Glucose, Bld: 138 mg/dL — ABNORMAL HIGH (ref 70–99)
Potassium: 4.3 mmol/L (ref 3.5–5.1)
Sodium: 139 mmol/L (ref 135–145)
Total Bilirubin: 0.3 mg/dL (ref 0.3–1.2)
Total Protein: 5.9 g/dL — ABNORMAL LOW (ref 6.5–8.1)

## 2019-08-13 LAB — HIV ANTIBODY (ROUTINE TESTING W REFLEX): HIV Screen 4th Generation wRfx: NONREACTIVE

## 2019-08-13 LAB — SARS CORONAVIRUS 2 BY RT PCR (HOSPITAL ORDER, PERFORMED IN ~~LOC~~ HOSPITAL LAB): SARS Coronavirus 2: NEGATIVE

## 2019-08-13 LAB — SURGICAL PCR SCREEN
MRSA, PCR: NEGATIVE
Staphylococcus aureus: NEGATIVE

## 2019-08-13 LAB — LIPASE, BLOOD: Lipase: 40 U/L (ref 11–51)

## 2019-08-13 MED ORDER — MORPHINE SULFATE (PF) 4 MG/ML IV SOLN
4.0000 mg | Freq: Once | INTRAVENOUS | Status: AC
  Administered 2019-08-13: 4 mg via INTRAVENOUS
  Filled 2019-08-13: qty 1

## 2019-08-13 MED ORDER — LEVOTHYROXINE SODIUM 25 MCG PO TABS: 25.0000 ug | ORAL_TABLET | Freq: Every day | ORAL | Status: DC

## 2019-08-13 MED ORDER — ALBUTEROL SULFATE HFA 108 (90 BASE) MCG/ACT IN AERS
2.0000 | INHALATION_SPRAY | Freq: Four times a day (QID) | RESPIRATORY_TRACT | Status: DC | PRN
  Filled 2019-08-13: qty 6.7

## 2019-08-13 MED ORDER — ACETAMINOPHEN 650 MG RE SUPP: 650.0000 mg | Freq: Four times a day (QID) | RECTAL | Status: DC | PRN

## 2019-08-13 MED ORDER — ONDANSETRON HCL 4 MG/2ML IJ SOLN
4.0000 mg | Freq: Four times a day (QID) | INTRAMUSCULAR | Status: DC | PRN
  Administered 2019-08-13: 4 mg via INTRAVENOUS
  Filled 2019-08-13: qty 2

## 2019-08-13 MED ORDER — MORPHINE SULFATE (PF) 4 MG/ML IV SOLN
4.0000 mg | INTRAVENOUS | Status: DC | PRN
  Administered 2019-08-13: 4 mg via INTRAVENOUS
  Filled 2019-08-13: qty 1

## 2019-08-13 MED ORDER — ONDANSETRON HCL 4 MG/2ML IJ SOLN
4.0000 mg | Freq: Once | INTRAMUSCULAR | Status: AC
  Administered 2019-08-13: 4 mg via INTRAVENOUS
  Filled 2019-08-13: qty 2

## 2019-08-13 MED ORDER — SODIUM CHLORIDE 0.9% FLUSH
3.0000 mL | Freq: Once | INTRAVENOUS | Status: AC
  Administered 2019-08-13: 3 mL via INTRAVENOUS

## 2019-08-13 MED ORDER — SODIUM CHLORIDE 0.9 % IV BOLUS
1000.0000 mL | Freq: Once | INTRAVENOUS | Status: AC
  Administered 2019-08-13: 1000 mL via INTRAVENOUS

## 2019-08-13 MED ORDER — ACETAMINOPHEN 325 MG PO TABS
650.0000 mg | ORAL_TABLET | Freq: Four times a day (QID) | ORAL | Status: DC | PRN
  Administered 2019-08-13: 650 mg via ORAL
  Filled 2019-08-13: qty 2

## 2019-08-13 MED ORDER — BUPROPION HCL ER (XL) 150 MG PO TB24
300.0000 mg | ORAL_TABLET | Freq: Every day | ORAL | Status: DC
  Administered 2019-08-13: 300 mg via ORAL
  Filled 2019-08-13: qty 2

## 2019-08-13 MED ORDER — PROPRANOLOL HCL 20 MG PO TABS
60.0000 mg | ORAL_TABLET | Freq: Two times a day (BID) | ORAL | Status: DC
  Administered 2019-08-13: 60 mg via ORAL
  Filled 2019-08-13: qty 1
  Filled 2019-08-13 (×2): qty 3

## 2019-08-13 MED ORDER — POTASSIUM CHLORIDE IN NACL 20-0.9 MEQ/L-% IV SOLN
INTRAVENOUS | Status: DC
  Filled 2019-08-13 (×3): qty 1000

## 2019-08-13 MED ORDER — MUPIROCIN 2 % EX OINT
1.0000 "application " | TOPICAL_OINTMENT | Freq: Two times a day (BID) | CUTANEOUS | Status: DC
  Administered 2019-08-13: 1 via NASAL
  Filled 2019-08-13: qty 22

## 2019-08-13 MED ORDER — DIPHENHYDRAMINE HCL 50 MG/ML IJ SOLN: 25.0000 mg | Freq: Four times a day (QID) | INTRAMUSCULAR | Status: DC | PRN

## 2019-08-13 MED ORDER — ONDANSETRON 4 MG PO TBDP: 4.0000 mg | ORAL_TABLET | Freq: Four times a day (QID) | ORAL | Status: DC | PRN

## 2019-08-13 MED ORDER — PANTOPRAZOLE SODIUM 40 MG PO TBEC
40.0000 mg | DELAYED_RELEASE_TABLET | Freq: Every day | ORAL | Status: DC
  Administered 2019-08-13: 40 mg via ORAL
  Filled 2019-08-13: qty 1

## 2019-08-13 MED ORDER — ESCITALOPRAM OXALATE 20 MG PO TABS
20.0000 mg | ORAL_TABLET | Freq: Every day | ORAL | Status: DC
  Administered 2019-08-13: 20 mg via ORAL
  Filled 2019-08-13: qty 1

## 2019-08-13 MED ORDER — FAMOTIDINE IN NACL 20-0.9 MG/50ML-% IV SOLN
20.0000 mg | Freq: Once | INTRAVENOUS | Status: AC
  Administered 2019-08-13: 20 mg via INTRAVENOUS
  Filled 2019-08-13: qty 50

## 2019-08-13 MED ORDER — MORPHINE SULFATE (PF) 2 MG/ML IV SOLN
2.0000 mg | INTRAVENOUS | Status: DC | PRN
  Administered 2019-08-14: 2 mg via INTRAVENOUS
  Filled 2019-08-13: qty 1

## 2019-08-13 MED ORDER — SODIUM CHLORIDE 0.9 % IV SOLN
2.0000 g | INTRAVENOUS | Status: DC
  Administered 2019-08-13: 2 g via INTRAVENOUS
  Filled 2019-08-13: qty 2
  Filled 2019-08-13: qty 20

## 2019-08-13 MED ORDER — ALBUTEROL SULFATE (2.5 MG/3ML) 0.083% IN NEBU: 2.5000 mg | INHALATION_SOLUTION | Freq: Four times a day (QID) | RESPIRATORY_TRACT | Status: DC | PRN

## 2019-08-13 MED ORDER — DIPHENHYDRAMINE HCL 25 MG PO CAPS: 25.0000 mg | ORAL_CAPSULE | Freq: Four times a day (QID) | ORAL | Status: DC | PRN

## 2019-08-13 NOTE — ED Triage Notes (Signed)
Pt. Stated, I started having stomach pain with some nausea around 0200.

## 2019-08-13 NOTE — H&P (Signed)
Dominique [name] is an 59 y.o. female.   Chief Complaint: RUQ pain HPI: This is a 59 year old female who presents with acute onset of RUQ abdominal pain with nausea and vomiting.  No diarrhea.  No previous episodes.  Work-up revealed normal LFT's but a large gallstone and a 3 cm ball of sludge.  No definite signs of acute cholecystitis, but the patient continues to require pain meds.  Past Medical History:  Diagnosis Date  . Anxiety   . Asthma   . Cervix prolapsed into vagina    Uro-Gyne Exam 2011, 2014  . Depression   . GERD (gastroesophageal reflux disease)   . Hearing loss   . High cholesterol   . Sleep apnea    faithful with cpap  . Tremor     Past Surgical History:  Procedure Laterality Date  . DILATION AND CURETTAGE OF UTERUS  02/24/1990  . MOUTH SURGERY     3 teeth removed   . ROOT CANAL    . ROOT CANAL     2 times   . VAGINAL DELIVERY     1990  . VAGINAL DELIVERY     1986    Family History  Problem Relation Age of Onset  . Hypertension Mother   . High Cholesterol Mother   . Cataracts Mother   . Retinal detachment Mother   . Colon cancer Mother   . Hypertension Brother   . High Cholesterol Brother   . Depression Daughter   . Menstrual problems Daughter   . Anxiety disorder Daughter   . ADD / ADHD Son   . Depression Son   . Anxiety disorder Son   . Heart disease Maternal Uncle   . Diabetes Paternal Aunt   . Breast cancer Paternal Aunt   . Diabetes Paternal Grandfather   . Arthritis Paternal Grandfather   . Breast cancer Cousin    Social History:  reports that she has never smoked. She has never used smokeless tobacco. She reports current alcohol use. She reports that she does not use drugs.  Allergies:  Allergies  Allergen Reactions  . Bactrim [Sulfamethoxazole-Trimethoprim] Rash  . Sulfa Antibiotics Rash  . Sulfur Rash   Prior to Admission medications   Medication Sig Start Date End Date Taking? Authorizing Provider  albuterol (PROVENTIL  HFA;VENTOLIN HFA) 108 (90 Base) MCG/ACT inhaler Inhale 1 puff into the lungs every 6 (six) hours as needed for wheezing or shortness of breath. Patient taking differently: Inhale 2 puffs into the lungs every 6 (six) hours as needed for wheezing or shortness of breath.  11/12/16  Yes Eulas Post, Monica, DO  buPROPion (WELLBUTRIN XL) 300 MG 24 hr tablet TAKE 1 TABLET DAILY Patient taking differently: Take 300 mg by mouth daily.  07/15/19  Yes Vivi Barrack, MD  escitalopram (LEXAPRO) 20 MG tablet TAKE 1 TABLET DAILY Patient taking differently: Take 20 mg by mouth daily.  07/15/19  Yes Vivi Barrack, MD  ferrous sulfate 325 (65 FE) MG tablet Take 325 mg by mouth See admin instructions. 3-4 nights weekly    Yes [provider]  omeprazole (PRILOSEC) 20 MG capsule Take 1 capsule (20 mg total) by mouth daily. 02/11/19  Yes Vivi Barrack, MD  propranolol (INDERAL) 60 MG tablet TAKE 1 TABLET TWICE A DAY Patient taking differently: Take 60 mg by mouth 2 (two) times daily.  07/15/19  Yes Vivi Barrack, MD  psyllium (METAMUCIL) 58.6 % packet Take 1 packet by mouth daily.  Yes [provider]  SYNTHROID 25 MCG tablet TAKE 1 TABLET DAILY BEFORE BREAKFAST Patient taking differently: Take 25 mcg by mouth daily before breakfast.  07/15/19  Yes Vivi Barrack, MD     Results for orders placed or performed during the hospital encounter of 08/13/19 (from the past 48 hour(s))  Lipase, blood     Status: None   Collection Time: 08/13/19  7:55 AM  Result Value Ref Range   Lipase 40 11 - 51 U/L    Comment: Performed at [facility], [place] [address], [place], [place] [address]  Comprehensive metabolic panel     Status: Abnormal   Collection Time: 08/13/19  7:55 AM  Result Value Ref Range   Sodium 139 135 - 145 mmol/L   Potassium 4.3 3.5 - 5.1 mmol/L   Chloride 109 98 - 111 mmol/L   CO2 21 (L) 22 - 32 mmol/L   Glucose, Bld 138 (H) 70 - 99 mg/dL    Comment: Glucose reference range applies  only to samples taken after fasting for at least 8 hours.   BUN 13 6 - 20 mg/dL   Creatinine, Ser 0.96 0.44 - 1.00 mg/dL   Calcium 9.1 8.9 - 10.3 mg/dL   Total Protein 5.9 (L) 6.5 - 8.1 g/dL   Albumin 3.7 3.5 - 5.0 g/dL   AST 15 15 - 41 U/L   ALT 16 0 - 44 U/L   Alkaline Phosphatase 82 38 - 126 U/L   Total Bilirubin 0.3 0.3 - 1.2 mg/dL   GFR calc non Af Amer >60 >60 mL/min   GFR calc Af Amer >60 >60 mL/min   Anion gap 9 5 - 15    Comment: Performed at [facility], [place] [address], [place], [place] [address]  CBC     Status: None   Collection Time: 08/13/19  7:55 AM  Result Value Ref Range   WBC 7.4 4.0 - 10.5 K/uL   RBC 4.26 3.87 - 5.11 MIL/uL   Hemoglobin 13.5 12.0 - 15.0 g/dL   HCT 40.0 36 - 46 %   MCV 93.9 80.0 - 100.0 fL   MCH 31.7 26.0 - 34.0 pg   MCHC 33.8 30.0 - 36.0 g/dL   RDW 12.4 11.5 - 15.5 %   Platelets 165 150 - 400 K/uL   nRBC 0.0 0.0 - 0.2 %    Comment: Performed at [facility], [place] [address], [place], [place] [address]  SARS Coronavirus 2 by RT PCR (hospital order, performed in [facility] hospital lab) Nasopharyngeal Nasopharyngeal Swab     Status: None   Collection Time: 08/13/19 11:42 AM   Specimen: Nasopharyngeal Swab  Result Value Ref Range   SARS Coronavirus 2 NEGATIVE NEGATIVE    Comment: (NOTE) SARS-CoV-2 target nucleic acids are NOT DETECTED.  The SARS-CoV-2 RNA is generally detectable in upper and lower respiratory specimens during the acute phase of infection. The lowest concentration of SARS-CoV-2 viral copies this assay can detect is 250 copies / mL. A negative result does not preclude SARS-CoV-2 infection and should not be used as the sole basis for treatment or other patient management decisions.  A negative result may occur with improper specimen collection / handling, submission of specimen other than nasopharyngeal swab, presence of viral mutation(s) within the areas targeted by this assay, and inadequate number of viral  copies (<250 copies / mL). A negative result must be combined with clinical observations, patient history, and epidemiological  information.  Fact Sheet for Patients:   StrictlyIdeas.no  Fact Sheet for Healthcare Providers: BankingDealers.co.za  This test is not yet approved or  cleared by the [place] FDA and has been authorized for detection and/or diagnosis of SARS-CoV-2 by FDA under an Emergency Use Authorization (EUA).  This EUA will remain in effect (meaning this test can be used) for the duration of the COVID-19 declaration under Section 564(b)(1) of the Act, 21 U.S.C. section 360bbb-3(b)(1), unless the authorization is terminated or revoked sooner.  Performed at [facility], [place] [address] [place], [place] [address]    CT Renal Stone Study  Result Date: 08/13/2019 CLINICAL DATA:  Flank pain.  Suspect kidney stone.  Some nausea. EXAM: CT ABDOMEN AND PELVIS WITHOUT CONTRAST TECHNIQUE: Multidetector CT imaging of the abdomen and pelvis was performed following the standard protocol without IV contrast. COMPARISON:  None. FINDINGS: Lower chest: Lung bases demonstrate mild linear atelectasis/scarring over the anterior right middle lobe. Hepatobiliary: Evidence of mild cholelithiasis. Liver and biliary tree are normal. Pancreas: Normal. Spleen: Normal. Adrenals/Urinary Tract: Adrenal glands are normal. Kidneys are normal in size without hydronephrosis or nephrolithiasis. No perinephric inflammation or fluid. Ureters and bladder are normal. Stomach/Bowel: Stomach and small bowel are normal. Mild air and stool throughout the colon. Appendix is normal. Vascular/Lymphatic: Abdominal aorta is normal caliber. No evidence of adenopathy. Reproductive: Normal. Other: No free fluid or focal inflammatory change. Musculoskeletal: Possible hemangioma over the posterior left acetabular region. IMPRESSION: 1. No evidence of renal/ureteral stones or  obstruction. No acute findings in the abdomen/pelvis. 2.  Cholelithiasis. Electronically Signed   By: Marin Olp M.D.   On: 08/13/2019 09:51   US Abdomen Limited RUQ  Result Date: 08/13/2019 CLINICAL DATA:  Right upper quadrant pain 9 hours.  Nausea. EXAM: ULTRASOUND ABDOMEN LIMITED RIGHT UPPER QUADRANT COMPARISON:  None. FINDINGS: Gallbladder: Evidence of cholelithiasis with largest stone measuring 1.5 cm. 3 cm nonshadowing echogenic mobile focus within the gallbladder which may represent a irregular nonshadowing stone versus tumefactive sludge. No gallbladder wall thickening. Negative sonographic Murphy sign. No adjacent free fluid. Common bile duct: Diameter: 2.9 mm. Liver: No focal lesion identified. Within normal limits in parenchymal echogenicity. Portal vein is patent on color Doppler imaging with normal direction of blood flow towards the liver. Other: None. IMPRESSION: Cholelithiasis with possible associated tumefactive sludge. No additional sonographic evidence to suggest acute cholecystitis. Electronically Signed   By: Marin Olp M.D.   On: 08/13/2019 11:30    Review of Systems  HENT: Negative for ear discharge, ear pain, hearing loss and tinnitus.   Eyes: Negative for photophobia and pain.  Respiratory: Negative for cough and shortness of breath.   Cardiovascular: Negative for chest pain.  Gastrointestinal: Positive for abdominal distention, abdominal pain, nausea and vomiting.  Genitourinary: Negative for dysuria, flank pain, frequency and urgency.  Musculoskeletal: Negative for back pain, myalgias and neck pain.  Neurological: Negative for dizziness and headaches.  Hematological: Does not bruise/bleed easily.  Psychiatric/Behavioral: The patient is not nervous/anxious.     Blood pressure 126/73, pulse (!) 57, temperature 98.1 F (36.7 C), temperature source Oral, resp. rate 16, SpO2 100 %. Physical Exam  Constitutional:  WDWN in NAD, conversant, no obvious deformities;  lying in bed comfortably Eyes:  Pupils equal, round; sclera anicteric; moist conjunctiva; no lid lag HENT:  Oral mucosa moist; good dentition  Neck:  No masses palpated, trachea midline; no thyromegaly Lungs:  CTA bilaterally; normal respiratory effort CV:  Regular rate and rhythm;  no murmurs; extremities well-perfused with no edema Abd:  +bowel sounds, soft, mildly tender in RUQ; no palpable organomegaly; no palpable hernias Musc:  Unable to assess gait; no apparent clubbing or cyanosis in extremities Lymphatic:  No palpable cervical or axillary lymphadenopathy Skin:  Warm, dry; no sign of jaundice Psychiatric - alert and oriented x 4; calm mood and affect  Assessment/Plan Early acute cholecystitis  Patient has a trip to [place] planned for 6/22.  We discussed her options.  Operating tomorrow morning will likely result in some residual tenderness, but low risk of serious complications.  Traveling to [place] without surgery could result in more attacks of biliary colic, choledocholithiasis with jaundice, or pancreatitis.  After discussion, we will plan to proceed with laparoscopic cholecystectomy tomorrow by Dr. Kieth Brightly.  Possible discharge after surgery.  Maia Petties, MD 08/13/2019, 1:27 PM

## 2019-08-13 NOTE — ED Provider Notes (Signed)
[facility] EMERGENCY DEPARTMENT Provider Note   CSN: [id number] Arrival date & time: 08/13/19  0737     History Chief Complaint  Patient presents with  . Nausea  . Abdominal Pain    Dominique [name] is a 59 y.o. female.  59 yo F with a chief complaints of abdominal pain.  This started slowly at 2 AM and then progressively worsened throughout the morning.  She describes it as severe and persistent.  Has made her vomit a few times.  No fevers or chills.  No diarrhea.  She points to her right lower abdomen as the area of pain.  Has never had pain like this before.  Had a D&C in the remote past but denies other abdominal surgery.  She denies trauma.  The history is provided by the patient.  Abdominal Pain Pain location:  R flank Pain quality: sharp and shooting   Pain radiates to:  Does not radiate Pain severity:  Severe Onset quality:  Gradual Duration:  6 hours Timing:  Constant Progression:  Worsening Chronicity:  New Relieved by:  Nothing Worsened by:  Nothing Ineffective treatments:  None tried Associated symptoms: nausea and vomiting   Associated symptoms: no chest pain, no chills, no diarrhea, no dysuria, no fever and no shortness of breath        Past Medical History:  Diagnosis Date  . Anxiety   . Asthma   . Cervix prolapsed into vagina    Uro-Gyne Exam 2011, 2014  . Depression   . GERD (gastroesophageal reflux disease)   . Hearing loss   . High cholesterol   . Sleep apnea    faithful with cpap  . Tremor     Patient Active Problem List   Diagnosis Date Noted  . Arthritis 01/28/2019  . Asthma, moderate 01/28/2019  . Depression 01/28/2019  . Depression, major, single episode, complete remission ([place]) 12/14/2018  . Subclinical hypothyroidism 08/06/2018  . Mild intermittent asthma 08/06/2018  . GERD (gastroesophageal reflux disease) 08/06/2018  . Low vitamin D level 08/06/2018  . Restless legs syndrome 08/06/2018  . OSA on CPAP  08/06/2018  . Mixed hyperlipidemia 09/09/2017  . Benign essential tremor 09/09/2017  . Anxiety 09/09/2017  . Hearing loss 07/29/2013  . Tremor 09/09/2012  . Environmental allergies 04/29/2012  . Heartburn 10/21/2011  . Uterovaginal prolapse 05/06/2011    Past Surgical History:  Procedure Laterality Date  . DILATION AND CURETTAGE OF UTERUS  02/24/1990  . MOUTH SURGERY     3 teeth removed   . ROOT CANAL    . ROOT CANAL     2 times   . VAGINAL DELIVERY     1990  . VAGINAL DELIVERY     1986     OB History   No obstetric history on file.     Family History  Problem Relation Age of Onset  . Hypertension Mother   . High Cholesterol Mother   . Cataracts Mother   . Retinal detachment Mother   . Colon cancer Mother   . Hypertension Brother   . High Cholesterol Brother   . Depression Daughter   . Menstrual problems Daughter   . Anxiety disorder Daughter   . ADD / ADHD Son   . Depression Son   . Anxiety disorder Son   . Heart disease Maternal Uncle   . Diabetes Paternal Aunt   . Breast cancer Paternal Aunt   . Diabetes Paternal Grandfather   . Arthritis Paternal Grandfather   .  Breast cancer Cousin     Social History   Tobacco Use  . Smoking status: Never Smoker  . Smokeless tobacco: Never Used  Vaping Use  . Vaping Use: Never used  Substance Use Topics  . Alcohol use: Yes  . Drug use: No    Home Medications Prior to Admission medications   Medication Sig Start Date End Date Taking? Authorizing Provider  albuterol (PROVENTIL HFA;VENTOLIN HFA) 108 (90 Base) MCG/ACT inhaler Inhale 1 puff into the lungs every 6 (six) hours as needed for wheezing or shortness of breath. Patient taking differently: Inhale 2 puffs into the lungs every 6 (six) hours as needed for wheezing or shortness of breath.  11/12/16  Yes Eulas Post, Monica, DO  buPROPion (WELLBUTRIN XL) 300 MG 24 hr tablet TAKE 1 TABLET DAILY Patient taking differently: Take 300 mg by mouth daily.  07/15/19  Yes  Vivi Barrack, MD  escitalopram (LEXAPRO) 20 MG tablet TAKE 1 TABLET DAILY Patient taking differently: Take 20 mg by mouth daily.  07/15/19  Yes Vivi Barrack, MD  ferrous sulfate 325 (65 FE) MG tablet Take 325 mg by mouth See admin instructions. 3-4 nights weekly    Yes [provider]  omeprazole (PRILOSEC) 20 MG capsule Take 1 capsule (20 mg total) by mouth daily. 02/11/19  Yes Vivi Barrack, MD  propranolol (INDERAL) 60 MG tablet TAKE 1 TABLET TWICE A DAY Patient taking differently: Take 60 mg by mouth 2 (two) times daily.  07/15/19  Yes Vivi Barrack, MD  psyllium (METAMUCIL) 58.6 % packet Take 1 packet by mouth daily.   Yes [provider]  SYNTHROID 25 MCG tablet TAKE 1 TABLET DAILY BEFORE BREAKFAST Patient taking differently: Take 25 mcg by mouth daily before breakfast.  07/15/19  Yes Vivi Barrack, MD    Allergies    Bactrim [sulfamethoxazole-trimethoprim], Sulfa antibiotics, and Sulfur  Review of Systems   Review of Systems  Constitutional: Negative for chills and fever.  HENT: Negative for congestion and rhinorrhea.   Eyes: Negative for redness and visual disturbance.  Respiratory: Negative for shortness of breath and wheezing.   Cardiovascular: Negative for chest pain and palpitations.  Gastrointestinal: Positive for abdominal pain, nausea and vomiting. Negative for diarrhea.  Genitourinary: Negative for dysuria and urgency.  Musculoskeletal: Negative for arthralgias and myalgias.  Skin: Negative for pallor and wound.  Neurological: Negative for dizziness and headaches.    Physical Exam Updated Vital Signs BP 126/73 (BP Location: Right Arm)   Pulse (!) 57   Temp 98.1 F (36.7 C) (Oral)   Resp 16   SpO2 100%   Physical Exam Vitals and nursing note reviewed.  Constitutional:      General: She is not in acute distress.    Appearance: She is well-developed. She is not diaphoretic.  HENT:     Head: Normocephalic and atraumatic.  Eyes:      Pupils: Pupils are equal, round, and reactive to light.  Cardiovascular:     Rate and Rhythm: Normal rate and regular rhythm.     Heart sounds: No murmur heard.  No friction rub. No gallop.   Pulmonary:     Effort: Pulmonary effort is normal.     Breath sounds: No wheezing or rales.  Abdominal:     General: There is no distension.     Palpations: Abdomen is soft.     Tenderness: There is abdominal tenderness in the right upper quadrant and epigastric area. Negative signs include Murphy's sign.  Musculoskeletal:        General: No tenderness.     Cervical back: Normal range of motion and neck supple.  Skin:    General: Skin is warm and dry.  Neurological:     Mental Status: She is alert and oriented to person, place, and time.  Psychiatric:        Behavior: Behavior normal.     ED Results / Procedures / Treatments   Labs (all labs ordered are listed, but only abnormal results are displayed) Labs Reviewed  COMPREHENSIVE METABOLIC PANEL - Abnormal; Notable for the following components:      Result Value   CO2 21 (*)    Glucose, Bld 138 (*)    Total Protein 5.9 (*)    All other components within normal limits  SARS CORONAVIRUS 2 BY RT PCR (HOSPITAL ORDER, [facility])  LIPASE, BLOOD  CBC  URINALYSIS, ROUTINE W REFLEX MICROSCOPIC    EKG None  Radiology CT Renal Stone Study  Result Date: 08/13/2019 CLINICAL DATA:  Flank pain.  Suspect kidney stone.  Some nausea. EXAM: CT ABDOMEN AND PELVIS WITHOUT CONTRAST TECHNIQUE: Multidetector CT imaging of the abdomen and pelvis was performed following the standard protocol without IV contrast. COMPARISON:  None. FINDINGS: Lower chest: Lung bases demonstrate mild linear atelectasis/scarring over the anterior right middle lobe. Hepatobiliary: Evidence of mild cholelithiasis. Liver and biliary tree are normal. Pancreas: Normal. Spleen: Normal. Adrenals/Urinary Tract: Adrenal glands are normal. Kidneys are normal in  size without hydronephrosis or nephrolithiasis. No perinephric inflammation or fluid. Ureters and bladder are normal. Stomach/Bowel: Stomach and small bowel are normal. Mild air and stool throughout the colon. Appendix is normal. Vascular/Lymphatic: Abdominal aorta is normal caliber. No evidence of adenopathy. Reproductive: Normal. Other: No free fluid or focal inflammatory change. Musculoskeletal: Possible hemangioma over the posterior left acetabular region. IMPRESSION: 1. No evidence of renal/ureteral stones or obstruction. No acute findings in the abdomen/pelvis. 2.  Cholelithiasis. Electronically Signed   By: Marin Olp M.D.   On: 08/13/2019 09:51   US Abdomen Limited RUQ  Result Date: 08/13/2019 CLINICAL DATA:  Right upper quadrant pain 9 hours.  Nausea. EXAM: ULTRASOUND ABDOMEN LIMITED RIGHT UPPER QUADRANT COMPARISON:  None. FINDINGS: Gallbladder: Evidence of cholelithiasis with largest stone measuring 1.5 cm. 3 cm nonshadowing echogenic mobile focus within the gallbladder which may represent a irregular nonshadowing stone versus tumefactive sludge. No gallbladder wall thickening. Negative sonographic Murphy sign. No adjacent free fluid. Common bile duct: Diameter: 2.9 mm. Liver: No focal lesion identified. Within normal limits in parenchymal echogenicity. Portal vein is patent on color Doppler imaging with normal direction of blood flow towards the liver. Other: None. IMPRESSION: Cholelithiasis with possible associated tumefactive sludge. No additional sonographic evidence to suggest acute cholecystitis. Electronically Signed   By: Marin Olp M.D.   On: 08/13/2019 11:30    Procedures Procedures (including critical care time)  Medications Ordered in ED Medications  morphine 4 MG/ML injection 4 mg (has no administration in time range)  sodium chloride flush (NS) 0.9 % injection 3 mL (3 mLs Intravenous Given 08/13/19 0834)  morphine 4 MG/ML injection 4 mg (4 mg Intravenous Given 08/13/19 0903)   ondansetron (ZOFRAN) injection 4 mg (4 mg Intravenous Given 08/13/19 0903)  sodium chloride 0.9 % bolus 1,000 mL (0 mLs Intravenous Stopped 08/13/19 1024)  morphine 4 MG/ML injection 4 mg (4 mg Intravenous Given 08/13/19 0934)  ondansetron (ZOFRAN) injection 4 mg (4 mg Intravenous Given 08/13/19 0934)  morphine 4 MG/ML injection 4 mg (4 mg Intravenous Given 08/13/19 1025)  famotidine (PEPCID) IVPB 20 mg premix (20 mg Intravenous New Bag/Given 08/13/19 1025)    ED Course  I have reviewed the triage vital signs and the nursing notes.  Pertinent labs & imaging results that were available during my care of the patient were reviewed by me and considered in my medical decision making (see chart for details).    MDM Rules/Calculators/A&P                          59 yo F with a chief complaints of right-sided abdominal pain.  History is most consistent with a kidney stone as she is describing colicky unrelenting pain that makes her vomit.  She appears very uncomfortable on my initial exam.  However she does have tenderness to the epigastrium in the right upper quadrant this could be due to vomiting, will obtain a CT stone study.  Labs IV fluids pain and nausea medicine and reassess.  The patients results and plan were reviewed and discussed.   Any x-rays performed were independently reviewed by myself.   Differential diagnosis were considered with the presenting HPI.  Medications  morphine 4 MG/ML injection 4 mg (has no administration in time range)  sodium chloride flush (NS) 0.9 % injection 3 mL (3 mLs Intravenous Given 08/13/19 0834)  morphine 4 MG/ML injection 4 mg (4 mg Intravenous Given 08/13/19 0903)  ondansetron (ZOFRAN) injection 4 mg (4 mg Intravenous Given 08/13/19 0903)  sodium chloride 0.9 % bolus 1,000 mL (0 mLs Intravenous Stopped 08/13/19 1024)  morphine 4 MG/ML injection 4 mg (4 mg Intravenous Given 08/13/19 0934)  ondansetron (ZOFRAN) injection 4 mg (4 mg Intravenous Given 08/13/19  0934)  morphine 4 MG/ML injection 4 mg (4 mg Intravenous Given 08/13/19 1025)  famotidine (PEPCID) IVPB 20 mg premix (20 mg Intravenous New Bag/Given 08/13/19 1025)    Vitals:   08/13/19 0741  BP: 126/73  Pulse: (!) 57  Resp: 16  Temp: 98.1 F (36.7 C)  TempSrc: Oral  SpO2: 100%    Final diagnoses:  Abdominal pain, RUQ  Symptomatic cholelithiasis    Admission/ observation were discussed with the admitting physician, patient and/or family and they are comfortable with the plan.   Final Clinical Impression(s) / ED Diagnoses Final diagnoses:  Abdominal pain, RUQ  Symptomatic cholelithiasis    Rx / DC Orders ED Discharge Orders    None       Deno Etienne, DO 08/13/19 1422

## 2019-08-13 NOTE — Progress Notes (Signed)
Auto CPAP (min 7-max 14) set up for pt with medium FFM. Advised pt to notify for RT if she needs any further assistance with machine. RT will continue to monitor.

## 2019-08-14 ENCOUNTER — Encounter (HOSPITAL_COMMUNITY)
Admission: EM | Disposition: A | Discharge status: Home / Self Care | Payer: Self-pay | Source: Home / Self Care | Attending: Emergency Medicine

## 2019-08-14 ENCOUNTER — Observation Stay (HOSPITAL_COMMUNITY): Payer: BC Managed Care – PPO | Admitting: Certified Registered"

## 2019-08-14 ENCOUNTER — Encounter (HOSPITAL_COMMUNITY): Payer: Self-pay

## 2019-08-14 HISTORY — PX: CHOLECYSTECTOMY: SHX55

## 2019-08-14 LAB — CBC
HCT: 40.8 % (ref 36.0–46.0)
Hemoglobin: 13.4 g/dL (ref 12.0–15.0)
MCH: 31 pg (ref 26.0–34.0)
MCHC: 32.8 g/dL (ref 30.0–36.0)
MCV: 94.4 fL (ref 80.0–100.0)
Platelets: 122 10*3/uL — ABNORMAL LOW (ref 150–400)
RBC: 4.32 MIL/uL (ref 3.87–5.11)
RDW: 12.1 % (ref 11.5–15.5)
WBC: 9.6 10*3/uL (ref 4.0–10.5)
nRBC: 0 % (ref 0.0–0.2)

## 2019-08-14 LAB — CREATININE, SERUM
Creatinine, Ser: 0.99 mg/dL (ref 0.44–1.00)
GFR calc Af Amer: >60 mL/min (ref >60)
GFR calc non Af Amer: >60 mL/min (ref >60)

## 2019-08-14 SURGERY — LAPAROSCOPIC CHOLECYSTECTOMY WITH INTRAOPERATIVE CHOLANGIOGRAM
Anesthesia: General | Site: Abdomen

## 2019-08-14 MED ORDER — ONDANSETRON HCL 4 MG/2ML IJ SOLN: 4.0000 mg | Freq: Four times a day (QID) | INTRAMUSCULAR | Status: DC | PRN

## 2019-08-14 MED ORDER — MIDAZOLAM HCL 2 MG/2ML IJ SOLN
INTRAMUSCULAR | Status: AC
  Filled 2019-08-14: qty 2

## 2019-08-14 MED ORDER — ORAL CARE MOUTH RINSE: 15.0000 mL | Freq: Once | OROMUCOSAL | Status: AC

## 2019-08-14 MED ORDER — EPHEDRINE 5 MG/ML INJ
INTRAVENOUS | Status: AC
  Filled 2019-08-14: qty 10

## 2019-08-14 MED ORDER — SUCCINYLCHOLINE CHLORIDE 20 MG/ML IJ SOLN
INTRAMUSCULAR | Status: DC | PRN
Start: 2019-08-14 — End: 2019-08-14
  Administered 2019-08-14: 100 mg via INTRAVENOUS

## 2019-08-14 MED ORDER — BUPIVACAINE HCL (PF) 0.25 % IJ SOLN
INTRAMUSCULAR | Status: AC
  Filled 2019-08-14: qty 30

## 2019-08-14 MED ORDER — PROPOFOL 10 MG/ML IV BOLUS
INTRAVENOUS | Status: AC
  Filled 2019-08-14: qty 20

## 2019-08-14 MED ORDER — ONDANSETRON HCL 4 MG/2ML IJ SOLN
INTRAMUSCULAR | Status: DC | PRN
Start: 2019-08-14 — End: 2019-08-14
  Administered 2019-08-14: 4 mg via INTRAVENOUS

## 2019-08-14 MED ORDER — SUCCINYLCHOLINE CHLORIDE 200 MG/10ML IV SOSY
PREFILLED_SYRINGE | INTRAVENOUS | Status: AC
  Filled 2019-08-14: qty 10

## 2019-08-14 MED ORDER — FENTANYL CITRATE (PF) 100 MCG/2ML IJ SOLN
INTRAMUSCULAR | Status: DC | PRN
  Administered 2019-08-14: 50 ug via INTRAVENOUS
  Administered 2019-08-14: 100 ug via INTRAVENOUS
  Administered 2019-08-14: 50 ug via INTRAVENOUS

## 2019-08-14 MED ORDER — SODIUM CHLORIDE 0.9 % IR SOLN
Status: DC | PRN
  Administered 2019-08-14: 1000 mL

## 2019-08-14 MED ORDER — LIDOCAINE 2% (20 MG/ML) 5 ML SYRINGE
INTRAMUSCULAR | Status: AC
  Filled 2019-08-14: qty 5

## 2019-08-14 MED ORDER — LIDOCAINE 2% (20 MG/ML) 5 ML SYRINGE
INTRAMUSCULAR | Status: DC | PRN
  Administered 2019-08-14: 60 mg via INTRAVENOUS

## 2019-08-14 MED ORDER — PROPOFOL 10 MG/ML IV BOLUS
INTRAVENOUS | Status: DC | PRN
  Administered 2019-08-14: 70 mg via INTRAVENOUS

## 2019-08-14 MED ORDER — MORPHINE SULFATE (PF) 2 MG/ML IV SOLN: 2.0000 mg | INTRAVENOUS | Status: DC | PRN

## 2019-08-14 MED ORDER — CHLORHEXIDINE GLUCONATE 0.12 % MT SOLN
OROMUCOSAL | Status: AC
  Administered 2019-08-14: 15 mL via OROMUCOSAL
  Filled 2019-08-14: qty 15

## 2019-08-14 MED ORDER — DEXAMETHASONE SODIUM PHOSPHATE 10 MG/ML IJ SOLN
INTRAMUSCULAR | Status: AC
  Filled 2019-08-14: qty 1

## 2019-08-14 MED ORDER — LACTATED RINGERS IV SOLN: INTRAVENOUS | Status: DC | PRN

## 2019-08-14 MED ORDER — BUPIVACAINE HCL 0.25 % IJ SOLN
INTRAMUSCULAR | Status: DC | PRN
  Administered 2019-08-14: 30 mL

## 2019-08-14 MED ORDER — GABAPENTIN 300 MG PO CAPS
300.0000 mg | ORAL_CAPSULE | Freq: Two times a day (BID) | ORAL | Status: DC
  Administered 2019-08-14 – 2019-08-15 (×3): 300 mg via ORAL
  Filled 2019-08-14 (×3): qty 1

## 2019-08-14 MED ORDER — DIPHENHYDRAMINE HCL 25 MG PO CAPS: 25.0000 mg | ORAL_CAPSULE | Freq: Four times a day (QID) | ORAL | Status: DC | PRN

## 2019-08-14 MED ORDER — KETOROLAC TROMETHAMINE 15 MG/ML IJ SOLN: 15.0000 mg | Freq: Four times a day (QID) | INTRAMUSCULAR | Status: DC | PRN

## 2019-08-14 MED ORDER — DIPHENHYDRAMINE HCL 50 MG/ML IJ SOLN: 25.0000 mg | Freq: Four times a day (QID) | INTRAMUSCULAR | Status: DC | PRN

## 2019-08-14 MED ORDER — ROCURONIUM BROMIDE 10 MG/ML (PF) SYRINGE
PREFILLED_SYRINGE | INTRAVENOUS | Status: AC
  Filled 2019-08-14: qty 10

## 2019-08-14 MED ORDER — OXYCODONE HCL 5 MG PO TABS
5.0000 mg | ORAL_TABLET | ORAL | Status: DC | PRN
  Administered 2019-08-14: 10 mg via ORAL
  Filled 2019-08-14: qty 2

## 2019-08-14 MED ORDER — ROCURONIUM BROMIDE 100 MG/10ML IV SOLN
INTRAVENOUS | Status: DC | PRN
  Administered 2019-08-14: 40 mg via INTRAVENOUS

## 2019-08-14 MED ORDER — ONDANSETRON HCL 4 MG/2ML IJ SOLN
INTRAMUSCULAR | Status: AC
  Filled 2019-08-14: qty 2

## 2019-08-14 MED ORDER — DEXAMETHASONE SODIUM PHOSPHATE 4 MG/ML IJ SOLN
INTRAMUSCULAR | Status: DC | PRN
Start: 2019-08-14 — End: 2019-08-14
  Administered 2019-08-14: 10 mg via INTRAVENOUS

## 2019-08-14 MED ORDER — CHLORHEXIDINE GLUCONATE 0.12 % MT SOLN: 15.0000 mL | Freq: Once | OROMUCOSAL | Status: AC

## 2019-08-14 MED ORDER — LACTATED RINGERS IV SOLN: INTRAVENOUS | Status: DC

## 2019-08-14 MED ORDER — PHENYLEPHRINE 40 MCG/ML (10ML) SYRINGE FOR IV PUSH (FOR BLOOD PRESSURE SUPPORT)
PREFILLED_SYRINGE | INTRAVENOUS | Status: AC
  Filled 2019-08-14: qty 10

## 2019-08-14 MED ORDER — ONDANSETRON 4 MG PO TBDP: 4.0000 mg | ORAL_TABLET | Freq: Four times a day (QID) | ORAL | Status: DC | PRN

## 2019-08-14 MED ORDER — MIDAZOLAM HCL 5 MG/5ML IJ SOLN
INTRAMUSCULAR | Status: DC | PRN
  Administered 2019-08-14: 2 mg via INTRAVENOUS

## 2019-08-14 MED ORDER — ENOXAPARIN SODIUM 40 MG/0.4ML ~~LOC~~ SOLN
40.0000 mg | SUBCUTANEOUS | Status: DC
  Administered 2019-08-15: 40 mg via SUBCUTANEOUS
  Filled 2019-08-14: qty 0.4

## 2019-08-14 MED ORDER — FENTANYL CITRATE (PF) 250 MCG/5ML IJ SOLN
INTRAMUSCULAR | Status: AC
  Filled 2019-08-14: qty 5

## 2019-08-14 MED ORDER — SUGAMMADEX SODIUM 200 MG/2ML IV SOLN
INTRAVENOUS | Status: DC | PRN
Start: 2019-08-14 — End: 2019-08-14
  Administered 2019-08-14: 200 mg via INTRAVENOUS

## 2019-08-14 MED ORDER — 0.9 % SODIUM CHLORIDE (POUR BTL) OPTIME
TOPICAL | Status: DC | PRN
  Administered 2019-08-14: 1000 mL

## 2019-08-14 SURGICAL SUPPLY — 41 items
APPLIER CLIP ROT 10 11.4 M/L (STAPLE)
BLADE CLIPPER SURG (BLADE) IMPLANT
CANISTER SUCT 3000ML PPV (MISCELLANEOUS) ×2 IMPLANT
CATH CHOLANG 76X19 KUMAR (CATHETERS) IMPLANT
CHLORAPREP W/TINT 26 (MISCELLANEOUS) ×4 IMPLANT
CLIP APPLIE ROT 10 11.4 M/L (STAPLE) IMPLANT
CLIP VESOLOCK MED LG 6/CT (CLIP) IMPLANT
COVER MAYO STAND STRL (DRAPES) ×2 IMPLANT
COVER SURGICAL LIGHT HANDLE (MISCELLANEOUS) ×2 IMPLANT
COVER WAND RF STERILE (DRAPES) IMPLANT
DERMABOND ADVANCED (GAUZE/BANDAGES/DRESSINGS) ×1
DERMABOND ADVANCED .7 DNX12 (GAUZE/BANDAGES/DRESSINGS) ×1 IMPLANT
DRAPE C-ARM 42X120 X-RAY (DRAPES) IMPLANT
ELECT REM PT RETURN 9FT ADLT (ELECTROSURGICAL) ×2
ELECTRODE REM PT RTRN 9FT ADLT (ELECTROSURGICAL) ×1 IMPLANT
GLOVE BIOGEL PI IND STRL 7.0 (GLOVE) ×1 IMPLANT
GLOVE BIOGEL PI INDICATOR 7.0 (GLOVE) ×1
GLOVE SURG SS PI 7.0 STRL IVOR (GLOVE) ×2 IMPLANT
GOWN STRL REUS W/ TWL LRG LVL3 (GOWN DISPOSABLE) ×3 IMPLANT
GOWN STRL REUS W/TWL LRG LVL3 (GOWN DISPOSABLE) ×3
GRASPER SUT TROCAR 14GX15 (MISCELLANEOUS) ×2 IMPLANT
KIT BASIN OR (CUSTOM PROCEDURE TRAY) ×2 IMPLANT
KIT TURNOVER KIT B (KITS) ×2 IMPLANT
NEEDLE 22X1 1/2 (OR ONLY) (NEEDLE) ×2 IMPLANT
NS IRRIG 1000ML POUR BTL (IV SOLUTION) ×2 IMPLANT
PAD ARMBOARD 7.5X6 YLW CONV (MISCELLANEOUS) ×2 IMPLANT
POUCH RETRIEVAL ECOSAC 10 (ENDOMECHANICALS) ×1 IMPLANT
POUCH RETRIEVAL ECOSAC 10MM (ENDOMECHANICALS) ×1
SCISSORS LAP 5X35 DISP (ENDOMECHANICALS) ×2 IMPLANT
SET IRRIG TUBING LAPAROSCOPIC (IRRIGATION / IRRIGATOR) ×2 IMPLANT
SET TUBE SMOKE EVAC HIGH FLOW (TUBING) ×2 IMPLANT
SLEEVE ENDOPATH XCEL 5M (ENDOMECHANICALS) ×4 IMPLANT
SPECIMEN JAR SMALL (MISCELLANEOUS) ×2 IMPLANT
STOPCOCK 4 WAY LG BORE MALE ST (IV SETS) IMPLANT
SUT MNCRL AB 4-0 PS2 18 (SUTURE) ×2 IMPLANT
TOWEL GREEN STERILE (TOWEL DISPOSABLE) ×2 IMPLANT
TOWEL GREEN STERILE FF (TOWEL DISPOSABLE) ×2 IMPLANT
TRAY LAPAROSCOPIC MC (CUSTOM PROCEDURE TRAY) ×2 IMPLANT
TROCAR XCEL 12X100 BLDLESS (ENDOMECHANICALS) ×2 IMPLANT
TROCAR XCEL NON-BLD 5MMX100MML (ENDOMECHANICALS) ×2 IMPLANT
WATER STERILE IRR 1000ML POUR (IV SOLUTION) ×2 IMPLANT

## 2019-08-14 NOTE — Progress Notes (Signed)
Pt states she wears an XS CPAP mask at home. RT tried a few different types of masks, and able to provide pt with small mask, but still leaking. Advised pt to have family member bring her personal mask from home today for more comfortable rest. RT will continue to monitor.

## 2019-08-14 NOTE — Discharge Summary (Addendum)
Patient ID: Dominique [name] [id number] 02-21-1961 59 y.o.  Admit date: 08/13/2019 Discharge date: 08/15/2019  Admitting Diagnosis: cholecystitis  Discharge Diagnosis Patient Active Problem List   Diagnosis Date Noted  . Acute cholecystitis due to biliary calculus 08/13/2019  . Arthritis 01/28/2019  . Asthma, moderate 01/28/2019  . Depression 01/28/2019  . Depression, major, single episode, complete remission ([place]) 12/14/2018  . Subclinical hypothyroidism 08/06/2018  . Mild intermittent asthma 08/06/2018  . GERD (gastroesophageal reflux disease) 08/06/2018  . Low vitamin D level 08/06/2018  . Restless legs syndrome 08/06/2018  . OSA on CPAP 08/06/2018  . Mixed hyperlipidemia 09/09/2017  . Benign essential tremor 09/09/2017  . Anxiety 09/09/2017  . Hearing loss 07/29/2013  . Tremor 09/09/2012  . Environmental allergies 04/29/2012  . Heartburn 10/21/2011  . Uterovaginal prolapse 05/06/2011  s/p lap chole  Consultants none  Reason for Admission: This is a 59 year old female who presents with acute onset of RUQ abdominal pain with nausea and vomiting.  No diarrhea.  No previous episodes.  Work-up revealed normal LFT's but a large gallstone and a 3 cm ball of sludge.  No definite signs of acute cholecystitis, but the patient continues to require pain meds.  Procedures S/p lap chole, Dr. Kieth Brightly 6/20  Hospital Course:  The patient was admitted and underwent a laparoscopic cholecystectomy.  The patient tolerated the procedure well.  On POD 1, the patient was tolerating a diet, voiding well, mobilizing, and pain was controlled with oral pain medications.  The patient was stable for DC home at this time with appropriate follow up made.  Physical Exam: Abd: soft, appropriately tender, incisions c/d/i with dermabond, +BS   Allergies as of 08/15/2019      Reactions   Bactrim [sulfamethoxazole-trimethoprim] Rash   Sulfa Antibiotics Rash   Sulfur Rash      Medication  List    TAKE these medications   albuterol 108 (90 Base) MCG/ACT inhaler Commonly known as: VENTOLIN HFA Inhale 1 puff into the lungs every 6 (six) hours as needed for wheezing or shortness of breath. What changed: how much to take   buPROPion 300 MG 24 hr tablet Commonly known as: WELLBUTRIN XL TAKE 1 TABLET DAILY   escitalopram 20 MG tablet Commonly known as: LEXAPRO TAKE 1 TABLET DAILY   ferrous sulfate 325 (65 FE) MG tablet Take 325 mg by mouth See admin instructions. 3-4 nights weekly   omeprazole 20 MG capsule Commonly known as: PRILOSEC Take 1 capsule (20 mg total) by mouth daily.   oxyCODONE 5 MG immediate release tablet Commonly known as: Oxy IR/ROXICODONE Take 1 tablet (5 mg total) by mouth every 4 (four) hours as needed for moderate pain.   propranolol 60 MG tablet Commonly known as: INDERAL TAKE 1 TABLET TWICE A DAY   psyllium 58.6 % packet Commonly known as: METAMUCIL Take 1 packet by mouth daily.   Synthroid 25 MCG tablet Generic drug: levothyroxine TAKE 1 TABLET DAILY BEFORE BREAKFAST What changed: how much to take         Follow-up Information    [facility] Follow up on 09/06/2019.   Specialty: General Surgery Why: 9:15am, please arrive 30 minutes prior to your appointment for paperwork and check in process Contact information: [address] [place] [address] [phone]               Signed: Saverio Danker, [facility] 08/15/2019, 11:14 AM Please see Amion for pager  number during day hours 7:00am-4:30pm, 7-11:30am on Weekends

## 2019-08-14 NOTE — Op Note (Signed)
PATIENT:  Dominique [name]  59 y.o. female  PRE-OPERATIVE DIAGNOSIS:  Acute Colecystitis  POST-OPERATIVE DIAGNOSIS:  Acute Colecystitis  PROCEDURE:  Procedure(s): LAPAROSCOPIC CHOLECYSTECTOMY   SURGEON:  Surgeon(s): Ondrea Dow, Arta Bruce, MD  ASSISTANT: none  ANESTHESIA:   local and general  Indications for procedure: [name] is a 59 y.o. female with symptoms of Abdominal pain and Nausea and vomiting consistent with gallbladder disease, Confirmed by Ultrasound.  Description of procedure: The patient was brought into the operative suite, placed supine. Anesthesia was administered with endotracheal tube. Patient was strapped in place and foot board was secured. All pressure points were offloaded by foam padding. The patient was prepped and draped in the usual sterile fashion.  A small incision was made to the right of the umbilicus. A 22mm trocar was inserted into the peritoneal cavity with optical entry. Pneumoperitoneum was applied with high flow low pressure. 2 27mm trocars were placed in the RUQ. A 80mm trocar was placed in the subxiphoid space. Marcaine was infused to the subxiphoid space and lateral upper right abdomen in the transversus abdominis plane. Next the patient was placed in reverse trendelenberg. The gallbladder was distended and red consistent with acute cholecystitis. Omentum was adhered to the gallbladder this was removed with cautery. There was a large amount of edema within the omentum.  The gallbladder was retracted cephalad and lateral. The peritoneum was reflected off the infundibulum working lateral to medial. The cystic duct and cystic artery were identified and further dissection revealed a critical view.  The cystic duct and cystic artery were doubly clipped and ligated.   The gallbladder was removed off the liver bed with cautery. There was a large amount of edema within the tissue. The Gallbladder was placed in a specimen bag. The gallbladder fossa was  irrigated and hemostasis was applied with cautery. The gallbladder was removed via the 4mm trocar. The fascial defect was closed with interrupted 0 vicryl suture via laparoscopic trans-fascial suture passer. Pneumoperitoneum was removed, all trocar were removed. All incisions were closed with 4-0 monocryl subcuticular stitch. The patient woke from anesthesia and was brought to PACU in stable condition. All counts were correct  Findings: acute cholecystitis  Specimen: gallbladder  Blood loss: 30 ml  Local anesthesia:  30 ml marcaine  Complications: none  PLAN OF CARE: Admit for overnight observation  PATIENT DISPOSITION:  PACU - hemodynamically stable.  Images:      Gurney Maxin, M.D. General, Bariatric, & Minimally Invasive Surgery [facility]

## 2019-08-14 NOTE — Progress Notes (Signed)
Pt able to use our small mask with CPAP again tonight. Pt states she was comfortable with small mask, hopes to go home in AM, so didn't have family bring personal equipment. RT will continue to monitor.

## 2019-08-14 NOTE — Anesthesia Procedure Notes (Signed)
Procedure Name: Intubation Date/Time: 08/14/2019 10:48 AM Performed by: Clovis Cao, CRNA Pre-anesthesia Checklist: Patient identified, Emergency Drugs available, Suction available, Patient being monitored and Timeout performed Patient Re-evaluated:Patient Re-evaluated prior to induction Oxygen Delivery Method: Circle system utilized Preoxygenation: Pre-oxygenation with 100% oxygen Induction Type: IV induction, Rapid sequence and Cricoid Pressure applied Laryngoscope Size: Miller and 2 Grade View: Grade I Tube type: Oral Tube size: 7.0 mm Number of attempts: 1 Airway Equipment and Method: Stylet Placement Confirmation: ETT inserted through vocal cords under direct vision,  positive ETCO2 and breath sounds checked- equal and bilateral Secured at: 21 cm Tube secured with: Tape Dental Injury: Teeth and Oropharynx as per pre-operative assessment

## 2019-08-14 NOTE — Progress Notes (Signed)
Pre Procedure note for inpatients:   Dominique [name] has been scheduled for Procedure(s): LAPAROSCOPIC CHOLECYSTECTOMY WITH POSSIBLE INTRAOPERATIVE CHOLANGIOGRAM (N/A) today. The various methods of treatment have been discussed with the patient. After consideration of the risks, benefits and treatment options the patient has consented to the planned procedure.   The patient has been seen and labs reviewed. There are no changes in the patients condition to prevent proceeding with the planned procedure today.  Recent labs:  Lab Results  Component Value Date   WBC 7.4 08/13/2019   HGB 13.5 08/13/2019   HCT 40.0 08/13/2019   PLT 165 08/13/2019   GLUCOSE 138 (H) 08/13/2019   CHOL 208 (H) 03/10/2018   TRIG 215 (H) 03/10/2018   HDL 38 (L) 03/10/2018   LDLCALC 134 (H) 03/10/2018   ALT 16 08/13/2019   AST 15 08/13/2019   NA 139 08/13/2019   K 4.3 08/13/2019   CL 109 08/13/2019   CREATININE 0.96 08/13/2019   BUN 13 08/13/2019   CO2 21 (L) 08/13/2019   TSH 2.36 01/13/2019    Mickeal Skinner, MD 08/14/2019 9:28 AM

## 2019-08-14 NOTE — Transfer of Care (Signed)
Immediate Anesthesia Transfer of Care Note  Patient: Kathlene Yano  Procedure(s) Performed: LAPAROSCOPIC CHOLECYSTECTOMY (N/A Abdomen)  Patient Location: PACU  Anesthesia Type:General  Level of Consciousness: drowsy  Airway & Oxygen Therapy: Patient Spontanous Breathing and Patient connected to face mask oxygen  Post-op Assessment: Report given to RN and Post -op Vital signs reviewed and stable  Post vital signs: Reviewed and stable  Last Vitals:  Vitals Value Taken Time  BP 144/67 08/14/19 1135  Temp 36.8 C 08/14/19 1135  Pulse 69 08/14/19 1137  Resp 15 08/14/19 1137  SpO2 98 % 08/14/19 1137  Vitals shown include unvalidated device data.  Last Pain:  Vitals:   08/14/19 1135  TempSrc:   PainSc: 0-No pain      Patients Stated Pain Goal: 1 (70/92/95 7473)  Complications: No complications documented.

## 2019-08-14 NOTE — Anesthesia Postprocedure Evaluation (Signed)
Anesthesia Post Note  Patient: Dominique [name]  Procedure(s) Performed: LAPAROSCOPIC CHOLECYSTECTOMY (N/A Abdomen)     Patient location during evaluation: PACU Anesthesia Type: General Level of consciousness: awake and alert Pain management: pain level controlled Vital Signs Assessment: post-procedure vital signs reviewed and stable Respiratory status: spontaneous breathing, nonlabored ventilation, respiratory function stable and patient connected to nasal cannula oxygen Cardiovascular status: blood pressure returned to baseline and stable Postop Assessment: no apparent nausea or vomiting Anesthetic complications: no   No complications documented.  Last Vitals:  Vitals:   08/14/19 1241 08/14/19 1405  BP: 135/81 130/80  Pulse: 61 63  Resp: 16 18  Temp: 36.9 C 36.9 C  SpO2: 93% 94%    Last Pain:  Vitals:   08/14/19 1405  TempSrc: Oral  PainSc:    Pain Goal: Patients Stated Pain Goal: 1 (08/14/19 0831)                 Barnet Glasgow

## 2019-08-14 NOTE — Anesthesia Preprocedure Evaluation (Addendum)
Anesthesia Evaluation  Patient identified by MRN, date of birth, ID band Patient awake    Reviewed: Allergy & Precautions, H&P , NPO status , Patient's Chart, lab work & pertinent test results  Airway Mallampati: II   Neck ROM: full    Dental  (+) Teeth Intact, Dental Advisory Given   Pulmonary asthma , sleep apnea ,    breath sounds clear to auscultation       Cardiovascular negative cardio ROS   Rhythm:regular Rate:Normal     Neuro/Psych PSYCHIATRIC DISORDERS Anxiety Depression    GI/Hepatic GERD  ,  Endo/Other  Hypothyroidism   Renal/GU      Musculoskeletal  (+) Arthritis ,   Abdominal   Peds  Hematology   Anesthesia Other Findings   Reproductive/Obstetrics                            Anesthesia Physical Anesthesia Plan  ASA: II  Anesthesia Plan: General   Post-op Pain Management:    Induction: Intravenous  PONV Risk Score and Plan: 3 and Ondansetron, Dexamethasone, Midazolam and Treatment may vary due to age or medical condition  Airway Management Planned: Oral ETT  Additional Equipment:   Intra-op Plan:   Post-operative Plan: Extubation in OR  Informed Consent: I have reviewed the patients History and Physical, chart, labs and discussed the procedure including the risks, benefits and alternatives for the proposed anesthesia with the patient or authorized representative who has indicated his/her understanding and acceptance.     Dental advisory given  Plan Discussed with: CRNA, Anesthesiologist and Surgeon  Anesthesia Plan Comments:        Anesthesia Quick Evaluation

## 2019-08-15 ENCOUNTER — Encounter (HOSPITAL_COMMUNITY): Payer: Self-pay

## 2019-08-15 ENCOUNTER — Other Ambulatory Visit: Payer: Self-pay

## 2019-08-15 ENCOUNTER — Encounter (HOSPITAL_COMMUNITY): Payer: Self-pay | Admitting: General Surgery

## 2019-08-15 MED ORDER — OXYCODONE HCL 5 MG PO TABS: 5.0000 mg | ORAL_TABLET | ORAL | 0 refills | Status: DC | PRN

## 2019-08-15 NOTE — Discharge Instructions (Signed)
[facility]  Please arrive at least 30 min before your appointment to complete your check in paperwork.  If you are unable to arrive 30 min prior to your appointment time we may have to cancel or reschedule you. LAPAROSCOPIC SURGERY: POST OP INSTRUCTIONS Always review your discharge instruction sheet given to you by the facility where your surgery was performed. IF YOU HAVE DISABILITY OR FAMILY LEAVE FORMS, YOU MUST BRING THEM TO THE OFFICE FOR PROCESSING.   DO NOT GIVE THEM TO YOUR DOCTOR.  PAIN CONTROL  1. First take acetaminophen (Tylenol) AND/or ibuprofen (Advil) to control your pain after surgery.  Follow directions on package.  Taking acetaminophen (Tylenol) and/or ibuprofen (Advil) regularly after surgery will help to control your pain and lower the amount of prescription pain medication you may need.  You should not take more than 4,000 mg (4 grams) of acetaminophen (Tylenol) in 24 hours.  You should not take ibuprofen (Advil), aleve, motrin, naprosyn or other NSAIDS if you have a history of stomach ulcers or chronic kidney disease.  2. A prescription for pain medication may be given to you upon discharge.  Take your pain medication as prescribed, if you still have uncontrolled pain after taking acetaminophen (Tylenol) or ibuprofen (Advil). 3. Use ice packs to help control pain. 4. If you need a refill on your pain medication, please contact your pharmacy.  They will contact our office to request authorization. Prescriptions will not be filled after 5pm or on week-ends.  HOME MEDICATIONS 5. Take your usually prescribed medications unless otherwise directed.  DIET 6. You should follow a light diet the first few days after arrival home.  Be sure to include lots of fluids daily. Avoid fatty, fried foods.   CONSTIPATION 7. It is common to experience some constipation after surgery and if you are taking pain medication.  Increasing fluid intake and taking a stool  softener (such as Colace) will usually help or prevent this problem from occurring.  A mild laxative (Milk of Magnesia or Miralax) should be taken according to package instructions if there are no bowel movements after 48 hours.  WOUND/INCISION CARE 8. Most patients will experience some swelling and bruising in the area of the incisions.  Ice packs will help.  Swelling and bruising can take several days to resolve.  9. Unless discharge instructions indicate otherwise, follow guidelines below  a. STERI-STRIPS - you may remove your outer bandages 48 hours after surgery, and you may shower at that time.  You have steri-strips (small skin tapes) in place directly over the incision.  These strips should be left on the skin for 7-10 days.   b. DERMABOND/SKIN GLUE - you may shower in 24 hours.  The glue will flake off over the next 2-3 weeks. 10. Any sutures or staples will be removed at the office during your follow-up visit.  ACTIVITIES 11. You may resume regular (light) daily activities beginning the next day--such as daily self-care, walking, climbing stairs--gradually increasing activities as tolerated.  You may have sexual intercourse when it is comfortable.  Refrain from any heavy lifting or straining until approved by your doctor. a. You may drive when you are no longer taking prescription pain medication, you can comfortably wear a seatbelt, and you can safely maneuver your car and apply brakes.  FOLLOW-UP 12. You should see your doctor in the office for a follow-up appointment approximately 2-3 weeks after your surgery.  You should have been given your post-op/follow-up appointment when   your surgery was scheduled.  If you did not receive a post-op/follow-up appointment, make sure that you call for this appointment within a day or two after you arrive home to insure a convenient appointment time.   WHEN TO CALL YOUR DOCTOR: 1. Fever over 101.0 2. Inability to urinate 3. Continued bleeding from  incision. 4. Increased pain, redness, or drainage from the incision. 5. Increasing abdominal pain  The clinic staff is available to answer your questions during regular business hours.  Please don't hesitate to call and ask to speak to one of the nurses for clinical concerns.  If you have a medical emergency, go to the nearest emergency room or call 911.  A surgeon from [facility] is always on call at the hospital. [address], [address], [place], [place]  [address] ? [address] [address], [place], [place]   [address] [phone] ? [phone] ? FAX [phone]  .........   Managing Your Pain After Surgery Without Opioids    Thank you for participating in our program to help patients manage their pain after surgery without opioids. This is part of our effort to provide you with the best care possible, without exposing you or your family to the risk that opioids pose.  What pain can I expect after surgery? You can expect to have some pain after surgery. This is normal. The pain is typically worse the day after surgery, and quickly begins to get better. Many studies have found that many patients are able to manage their pain after surgery with Over-the-Counter (OTC) medications such as Tylenol and Motrin. If you have a condition that does not allow you to take Tylenol or Motrin, notify your surgical team.  How will I manage my pain? The best strategy for controlling your pain after surgery is around the clock pain control with Tylenol (acetaminophen) and Motrin (ibuprofen or Advil). Alternating these medications with each other allows you to maximize your pain control. In addition to Tylenol and Motrin, you can use heating pads or ice packs on your incisions to help reduce your pain.  How will I alternate your regular strength over-the-counter pain medication? You will take a dose of pain medication every three hours. ; Start by taking 650 mg of Tylenol (2 pills of 325  mg) ; 3 hours later take 600 mg of Motrin (3 pills of 200 mg) ; 3 hours after taking the Motrin take 650 mg of Tylenol ; 3 hours after that take 600 mg of Motrin.   - 1 -  See example - if your first dose of Tylenol is at 12:00 PM   12:00 PM Tylenol 650 mg (2 pills of 325 mg)  3:00 PM Motrin 600 mg (3 pills of 200 mg)  6:00 PM Tylenol 650 mg (2 pills of 325 mg)  9:00 PM Motrin 600 mg (3 pills of 200 mg)  Continue alternating every 3 hours   We recommend that you follow this schedule around-the-clock for at least 3 days after surgery, or until you feel that it is no longer needed. Use the table on the last page of this handout to keep track of the medications you are taking. Important: Do not take more than 3000mg of Tylenol or 3200mg of Motrin in a 24-hour period. Do not take ibuprofen/Motrin if you have a history of bleeding stomach ulcers, severe kidney disease, &/or actively taking a blood thinner  What if I still have pain? If you have pain that is not   controlled with the over-the-counter pain medications (Tylenol and Motrin or Advil) you might have what we call "breakthrough" pain. You will receive a prescription for a small amount of an opioid pain medication such as Oxycodone, Tramadol, or Tylenol with Codeine. Use these opioid pills in the first 24 hours after surgery if you have breakthrough pain. Do not take more than 1 pill every 4-6 hours.  If you still have uncontrolled pain after using all opioid pills, don't hesitate to call our staff using the number provided. We will help make sure you are managing your pain in the best way possible, and if necessary, we can provide a prescription for additional pain medication.   Day 1    Time  Name of Medication Number of pills taken  Amount of Acetaminophen  Pain Level   Comments  AM PM       AM PM       AM PM       AM PM       AM PM       AM PM       AM PM       AM PM       Total Daily amount of Acetaminophen Do not  take more than  3,000 mg per day      Day 2    Time  Name of Medication Number of pills taken  Amount of Acetaminophen  Pain Level   Comments  AM PM       AM PM       AM PM       AM PM       AM PM       AM PM       AM PM       AM PM       Total Daily amount of Acetaminophen Do not take more than  3,000 mg per day      Day 3    Time  Name of Medication Number of pills taken  Amount of Acetaminophen  Pain Level   Comments  AM PM       AM PM       AM PM       AM PM          AM PM       AM PM       AM PM       AM PM       Total Daily amount of Acetaminophen Do not take more than  3,000 mg per day      Day 4    Time  Name of Medication Number of pills taken  Amount of Acetaminophen  Pain Level   Comments  AM PM       AM PM       AM PM       AM PM       AM PM       AM PM       AM PM       AM PM       Total Daily amount of Acetaminophen Do not take more than  3,000 mg per day      Day 5    Time  Name of Medication Number of pills taken  Amount of Acetaminophen  Pain Level   Comments  AM PM       AM PM       AM   PM       AM PM       AM PM       AM PM       AM PM       AM PM       Total Daily amount of Acetaminophen Do not take more than  3,000 mg per day       Day 6    Time  Name of Medication Number of pills taken  Amount of Acetaminophen  Pain Level  Comments  AM PM       AM PM       AM PM       AM PM       AM PM       AM PM       AM PM       AM PM       Total Daily amount of Acetaminophen Do not take more than  3,000 mg per day      Day 7    Time  Name of Medication Number of pills taken  Amount of Acetaminophen  Pain Level   Comments  AM PM       AM PM       AM PM       AM PM       AM PM       AM PM       AM PM       AM PM       Total Daily amount of Acetaminophen Do not take more than  3,000 mg per day        For additional information about how and where to safely dispose of unused  opioid medications - [employer]  Disclaimer: This document contains information and/or instructional materials adapted from [place] for the typical patient with your condition. It does not replace medical advice from your health care provider because your experience may differ from that of the typical patient. Talk to your health care provider if you have any questions about this document, your condition or your treatment plan. Adapted from [place] was admitted to the hospital 08/13/19, and underwent a laparoscopic Cholecystectomy 08/14/19. She was cleared to be discharged 08/15/19.

## 2019-08-15 NOTE — Progress Notes (Signed)
Discharge instructions (including medications) discussed with and copy provided to patient/caregiver 

## 2019-08-15 NOTE — Plan of Care (Signed)
  Problem: Education: Goal: Knowledge of General Education information will improve Description: Including pain rating scale, medication(s)/side effects and non-pharmacologic comfort measures Outcome: Adequate for Discharge   

## 2019-08-16 LAB — SURGICAL PATHOLOGY

## 2019-12-11 ENCOUNTER — Other Ambulatory Visit: Payer: Self-pay | Admitting: Family Medicine

## 2019-12-11 DIAGNOSIS — K219 Gastro-esophageal reflux disease without esophagitis: Secondary | ICD-10-CM

## 2020-01-05 NOTE — Progress Notes (Signed)
Assessment/Plan:    1.  Essential Tremor  -Patient currently on propranolol, 60 mg, 2 tablets daily.  Patient understands this can worsen depression, but psychiatry was actually willing to increase it.  -asked patient if wanted to add primidone (don't think that she can increase propranolol due to bradycardia) and she doesn't want to do that.  Relatively happy with tremor control.  Given that and fact that Vivi Barrack, MD currently filling propranolol, we will see her prn.  Happy to see her in f/u if needed in future.    2.  GAD/depression  -Follows with psychiatry.  On Wellbutrin and Lexapro.  Subjective:   Dominique [name] was seen today in follow up for essential tremor.  My previous records were reviewed prior to todays visit. Most of the time she doesn't notice tremor but sometimes it will come out.  Pt denies falls.  Pt denies lightheadedness, near syncope.  Medical records reviewed since last visit.  Patient had cholecystectomy in June.  Current prescribed movement disorder medications: Propranolol, 60 mg, 2 tablets daily   PREVIOUS MEDICATIONS: propranolol 60 mg bid  ALLERGIES:   Allergies  Allergen Reactions  . Bactrim [Sulfamethoxazole-Trimethoprim] Rash  . Sulfa Antibiotics Rash  . Sulfur Rash    CURRENT MEDICATIONS:  Outpatient Encounter Medications as of 01/10/2020  Medication Sig  . albuterol (PROVENTIL HFA;VENTOLIN HFA) 108 (90 Base) MCG/ACT inhaler Inhale 1 puff into the lungs every 6 (six) hours as needed for wheezing or shortness of breath. (Patient taking differently: Inhale 2 puffs into the lungs every 6 (six) hours as needed for wheezing or shortness of breath. )  . buPROPion (WELLBUTRIN XL) 300 MG 24 hr tablet TAKE 1 TABLET DAILY  . escitalopram (LEXAPRO) 20 MG tablet TAKE 1 TABLET DAILY  . ferrous sulfate 325 (65 FE) MG tablet Take 325 mg by mouth See admin instructions. 3-4 nights weekly   . omeprazole (PRILOSEC) 20 MG capsule TAKE 1 CAPSULE DAILY    . propranolol (INDERAL) 60 MG tablet TAKE 1 TABLET TWICE A DAY  . SYNTHROID 25 MCG tablet TAKE 1 TABLET DAILY BEFORE BREAKFAST  . [DISCONTINUED] oxyCODONE (OXY IR/ROXICODONE) 5 MG immediate release tablet Take 1 tablet (5 mg total) by mouth every 4 (four) hours as needed for moderate pain. (Patient not taking: Reported on 01/10/2020)  . [DISCONTINUED] psyllium (METAMUCIL) 58.6 % packet Take 1 packet by mouth daily. (Patient not taking: Reported on 01/10/2020)   No facility-administered encounter medications on file as of 01/10/2020.     Objective:    PHYSICAL EXAMINATION:    VITALS:   Vitals:   01/10/20 0817  BP: 120/77  Pulse: (!) 52  SpO2: 98%  Weight: 146 lb (66.2 kg)  Height: 5' 2.5" (1.588 m)    GEN:  The patient appears stated age and is in NAD. HEENT:  Normocephalic, atraumatic.  The mucous membranes are moist. The superficial temporal arteries are without ropiness or tenderness. CV:  Loletha Grayer.  regular Lungs:  CTAB Neck/HEME:  There are no carotid bruits bilaterally.  Neurological examination:  Orientation: The patient is alert and oriented x3. Cranial nerves: There is good facial symmetry. The speech is fluent and clear. Soft palate rises symmetrically and there is no tongue deviation. Hearing is intact to conversational tone. Sensation: Sensation is intact to light touch throughout Motor: Strength is 5/5 in the UE/LE  Movement examination: Tone: There is normal tone in the UE/LE Abnormal movements: No rest tremor.  She has postural tremor especially  when the arm is in the wing beating position, R>L.  Mild trouble with archimedes spirals, R>L.  Intermittent head tremor in the "yes" direction Coordination:  There is no dysmetria or dysdiachokinesia Gait and Station: The patient has no difficulty arising out of a deep-seated chair without the use of the hands. The patient's stride length is good.  Ambulates in tandem fashion well I have reviewed and interpreted the  following labs independently   Chemistry      Component Value Date/Time   NA 139 08/13/2019 0755   K 4.3 08/13/2019 0755   CL 109 08/13/2019 0755   CO2 21 (L) 08/13/2019 0755   BUN 13 08/13/2019 0755   CREATININE 0.99 08/14/2019 1314   CREATININE 0.98 03/10/2018 0907      Component Value Date/Time   CALCIUM 9.1 08/13/2019 0755   ALKPHOS 82 08/13/2019 0755   AST 15 08/13/2019 0755   ALT 16 08/13/2019 0755   BILITOT 0.3 08/13/2019 0755      Lab Results  Component Value Date   WBC 9.6 08/14/2019   HGB 13.4 08/14/2019   HCT 40.8 08/14/2019   MCV 94.4 08/14/2019   PLT 122 (L) 08/14/2019   Lab Results  Component Value Date   TSH 2.36 01/13/2019     Chemistry      Component Value Date/Time   NA 139 08/13/2019 0755   K 4.3 08/13/2019 0755   CL 109 08/13/2019 0755   CO2 21 (L) 08/13/2019 0755   BUN 13 08/13/2019 0755   CREATININE 0.99 08/14/2019 1314   CREATININE 0.98 03/10/2018 0907      Component Value Date/Time   CALCIUM 9.1 08/13/2019 0755   ALKPHOS 82 08/13/2019 0755   AST 15 08/13/2019 0755   ALT 16 08/13/2019 0755   BILITOT 0.3 08/13/2019 0755         Total time spent on today's visit was 20 minutes, including both face-to-face time and nonface-to-face time.  Time included that spent on review of records (prior notes available to me/labs/imaging if pertinent), discussing treatment and goals, answering patient's questions and coordinating care.  Cc:  Vivi Barrack, MD

## 2020-01-10 ENCOUNTER — Ambulatory Visit: Payer: BC Managed Care – PPO | Admitting: Neurology

## 2020-01-10 ENCOUNTER — Encounter: Payer: Self-pay | Admitting: Neurology

## 2020-01-10 ENCOUNTER — Other Ambulatory Visit: Payer: Self-pay

## 2020-01-10 VITALS — BP 120/77 | HR 52 | Ht 62.5 in | Wt 146.0 lb

## 2020-01-10 DIAGNOSIS — G250 Essential tremor: Secondary | ICD-10-CM | POA: Diagnosis not present

## 2020-01-10 NOTE — Patient Instructions (Signed)
Let me know if you need something from me from a tremor standpoint.  Good to see you!  Happy birthday!  The physicians and staff at [facility] are committed to providing excellent care. You may receive a survey requesting feedback about your experience at our office. We strive to receive "very good" responses to the survey questions. If you feel that your experience would prevent you from giving the office a "very good " response, please contact our office to try to remedy the situation. We may be reached at [phone]. Thank you for taking the time out of your busy day to complete the survey.

## 2020-01-31 ENCOUNTER — Ambulatory Visit: Payer: BC Managed Care – PPO | Admitting: Neurology

## 2020-02-24 ENCOUNTER — Encounter: Payer: Self-pay | Admitting: Family Medicine

## 2020-02-27 NOTE — Telephone Encounter (Signed)
Left message to return call if symptoms not improving, or with any questions.

## 2020-03-01 ENCOUNTER — Encounter: Payer: Self-pay | Admitting: Family Medicine

## 2020-03-02 NOTE — Telephone Encounter (Signed)
LVM to call as to schedule appointment with PCP

## 2020-03-07 ENCOUNTER — Telehealth: Payer: Self-pay | Admitting: Gastroenterology

## 2020-03-07 NOTE — Telephone Encounter (Signed)
Hi Dr. Loletha Carrow,  We received a colonoscopy report from the patient last procedure from 2013 for yo to review and provide recommendations on when she is due.   Please advise thank you

## 2020-03-08 NOTE — Telephone Encounter (Signed)
(  For documentation purposes -  report received from [facility] dated 06/18/2011, colonoscopy by Dr. Evalee Mutton.  It was the patient's for screening colonoscopy, and was a complete exam to the cecum with a good preparation and no polyps.)  Although this patient had no polyps on a complete colonoscopy under 10 years ago, I see from a portal message she sent to her primary care provider last week that her mother was recently diagnosed with colon cancer.  Therefore this patient is due for a screening colonoscopy for family history of colon cancer.  It can be directly booked with me through the [facility].  Please forward the chart onto [facility] for arrangements.  - H. Loletha Carrow, MD

## 2020-03-09 ENCOUNTER — Encounter: Payer: Self-pay | Admitting: Gastroenterology

## 2020-04-03 ENCOUNTER — Encounter: Payer: Self-pay | Admitting: Family Medicine

## 2020-04-03 ENCOUNTER — Ambulatory Visit (INDEPENDENT_AMBULATORY_CARE_PROVIDER_SITE_OTHER): Payer: BC Managed Care – PPO | Admitting: Family Medicine

## 2020-04-03 ENCOUNTER — Other Ambulatory Visit: Payer: Self-pay

## 2020-04-03 VITALS — BP 113/69 | HR 51 | Temp 97.8°F | Ht 62.5 in | Wt 145.6 lb

## 2020-04-03 DIAGNOSIS — E038 Other specified hypothyroidism: Secondary | ICD-10-CM

## 2020-04-03 DIAGNOSIS — F325 Major depressive disorder, single episode, in full remission: Secondary | ICD-10-CM | POA: Diagnosis not present

## 2020-04-03 MED ORDER — TRIAMCINOLONE ACETONIDE 0.1 % EX CREA
1.0000 | TOPICAL_CREAM | Freq: Two times a day (BID) | CUTANEOUS | 0 refills | Status: DC
Start: 2020-04-03 — End: 2020-04-17

## 2020-04-03 NOTE — Progress Notes (Signed)
   Dominique [name] is a 60 y.o. female who presents today for an office visit.  Assessment/Plan:  New/Acute Problems: Rash Consistent with reaction to recent shingles vaccine.  Will start topical triamcinolone.  Anticipate improvement over the next several days.  She will come back in a couple weeks for CPE and we can recheck labs at that point.  Chronic Problems Addressed Today: Depression, major, single episode, complete remission ([place]) She follows with psychiatry.  On Wellbutrin 300 mg daily and Lexapro 20 mg daily.  Having some libido issues.  She will follow up with psychiatry soon.  Subclinical hypothyroidism We will check a TSH with next blood draw.     Subjective:  HPI:  Patient here with rash for the last couple of days.  She received shingle vaccine about 2 weeks ago.  No other obvious exposures.  Rash involves posterior left ear and anterior scalp.  She tried taking Advil and antibiotic ointment with no significant improvement.  No other obvious exposures.  She is otherwise doing well.       Objective:  Physical Exam: BP 113/69   Pulse (!) 51   Temp 97.8 F (36.6 C) (Temporal)   Ht 5' 2.5" (1.588 m)   Wt 145 lb 9.6 oz (66 kg)   SpO2 98%   BMI 26.21 kg/m   Gen: No acute distress, resting comfortably Skin: Several discrete erythematous patches distributed across the left posterior and superior scalp.  Slight edema noted. Neuro: Grossly normal, moves all extremities Psych: Normal affect and thought content      Caleb M. [name], MD 04/03/2020 1:37 PM

## 2020-04-03 NOTE — Assessment & Plan Note (Signed)
She follows with psychiatry.  On Wellbutrin 300 mg daily and Lexapro 20 mg daily.  Having some libido issues.  She will follow up with psychiatry soon.

## 2020-04-03 NOTE — Assessment & Plan Note (Signed)
We will check a TSH with next blood draw.

## 2020-04-03 NOTE — Patient Instructions (Signed)
It was very nice to see you today!  Please try the triamcinolone.  Let me know if not proving.  I will see you back in a couple weeks for your annual checkup with labs.  Please come back to see me sooner if needed  Take care, Dr [name]  Please try these tips to maintain a healthy lifestyle:   Eat at least 3 REAL meals and 1-2 snacks per day.  Aim for no more than 5 hours between eating.  If you eat breakfast, please do so within one hour of getting up.    Each meal should contain half fruits/vegetables, one quarter protein, and one quarter carbs (no bigger than a computer mouse)   Cut down on sweet beverages. This includes juice, soda, and sweet tea.     Drink at least 1 glass of water with each meal and aim for at least 8 glasses per day   Exercise at least 150 minutes every week.

## 2020-04-11 ENCOUNTER — Encounter: Payer: Self-pay | Admitting: Family Medicine

## 2020-04-11 NOTE — Telephone Encounter (Signed)
Please advise 

## 2020-04-12 ENCOUNTER — Other Ambulatory Visit: Payer: Self-pay | Admitting: *Deleted

## 2020-04-12 MED ORDER — GABAPENTIN 100 MG PO CAPS: ORAL_CAPSULE | ORAL | 1 refills | Status: DC

## 2020-04-12 NOTE — Progress Notes (Signed)
gabapentine

## 2020-04-17 ENCOUNTER — Encounter: Payer: Self-pay | Admitting: Family Medicine

## 2020-04-17 ENCOUNTER — Ambulatory Visit (AMBULATORY_SURGERY_CENTER): Payer: Self-pay

## 2020-04-17 ENCOUNTER — Other Ambulatory Visit: Payer: Self-pay

## 2020-04-17 ENCOUNTER — Ambulatory Visit (INDEPENDENT_AMBULATORY_CARE_PROVIDER_SITE_OTHER): Payer: BC Managed Care – PPO | Admitting: Family Medicine

## 2020-04-17 VITALS — Ht 62.5 in | Wt 145.0 lb

## 2020-04-17 VITALS — BP 108/70 | HR 54 | Temp 98.1°F | Ht 62.5 in | Wt 146.4 lb

## 2020-04-17 DIAGNOSIS — R739 Hyperglycemia, unspecified: Secondary | ICD-10-CM

## 2020-04-17 DIAGNOSIS — Z6826 Body mass index (BMI) 26.0-26.9, adult: Secondary | ICD-10-CM

## 2020-04-17 DIAGNOSIS — Z79899 Other long term (current) drug therapy: Secondary | ICD-10-CM

## 2020-04-17 DIAGNOSIS — E782 Mixed hyperlipidemia: Secondary | ICD-10-CM

## 2020-04-17 DIAGNOSIS — R195 Other fecal abnormalities: Secondary | ICD-10-CM

## 2020-04-17 DIAGNOSIS — Z0001 Encounter for general adult medical examination with abnormal findings: Secondary | ICD-10-CM | POA: Diagnosis not present

## 2020-04-17 DIAGNOSIS — Z23 Encounter for immunization: Secondary | ICD-10-CM

## 2020-04-17 DIAGNOSIS — E038 Other specified hypothyroidism: Secondary | ICD-10-CM

## 2020-04-17 DIAGNOSIS — G250 Essential tremor: Secondary | ICD-10-CM

## 2020-04-17 DIAGNOSIS — F419 Anxiety disorder, unspecified: Secondary | ICD-10-CM | POA: Diagnosis not present

## 2020-04-17 DIAGNOSIS — F32A Depression, unspecified: Secondary | ICD-10-CM

## 2020-04-17 DIAGNOSIS — R7989 Other specified abnormal findings of blood chemistry: Secondary | ICD-10-CM | POA: Diagnosis not present

## 2020-04-17 DIAGNOSIS — Z800 Family history of malignant neoplasm of digestive organs: Secondary | ICD-10-CM

## 2020-04-17 DIAGNOSIS — F325 Major depressive disorder, single episode, in full remission: Secondary | ICD-10-CM

## 2020-04-17 DIAGNOSIS — J4520 Mild intermittent asthma, uncomplicated: Secondary | ICD-10-CM

## 2020-04-17 DIAGNOSIS — K2100 Gastro-esophageal reflux disease with esophagitis, without bleeding: Secondary | ICD-10-CM

## 2020-04-17 DIAGNOSIS — R928 Other abnormal and inconclusive findings on diagnostic imaging of breast: Secondary | ICD-10-CM

## 2020-04-17 DIAGNOSIS — G2581 Restless legs syndrome: Secondary | ICD-10-CM | POA: Diagnosis not present

## 2020-04-17 MED ORDER — ALBUTEROL SULFATE HFA 108 (90 BASE) MCG/ACT IN AERS: 1.0000 | INHALATION_SPRAY | Freq: Four times a day (QID) | RESPIRATORY_TRACT | 3 refills | Status: DC | PRN

## 2020-04-17 MED ORDER — CHOLESTYRAMINE 4 G PO PACK: 4.0000 g | PACK | Freq: Three times a day (TID) | ORAL | 12 refills | Status: DC

## 2020-04-17 MED ORDER — PLENVU 140 G PO SOLR
1.0000 | ORAL | 0 refills | Status: DC
Start: 2020-04-17 — End: 2020-05-01

## 2020-04-17 NOTE — Assessment & Plan Note (Signed)
Status post cholecystectomy.  Will start cholestyramine.

## 2020-04-17 NOTE — Assessment & Plan Note (Signed)
On omeprazole 20 mg daily.  Continue this.  Check B12.

## 2020-04-17 NOTE — Assessment & Plan Note (Signed)
Continue propranolol 60 mg daily.

## 2020-04-17 NOTE — Assessment & Plan Note (Signed)
Check lipids.  Will be starting cholestyramine as above which should help as well.

## 2020-04-17 NOTE — Assessment & Plan Note (Signed)
Check vitamin D level 

## 2020-04-17 NOTE — Patient Instructions (Addendum)
It was very nice to see you today!  Please try the cholestyramine to see if this helps with the loose stools associated with your gallbladder being out.  We will check blood work today.  I will place order for you to get your mammogram.  Please let us know if you have not heard anything about scheduling in the next couple of weeks.  We will give you your Tdap today.  We will check blood work.  I will see back in year for your next annual checkup.  Come back to see me sooner if needed.  Take care, Dr [name]  Please try these tips to maintain a healthy lifestyle:   Eat at least 3 REAL meals and 1-2 snacks per day.  Aim for no more than 5 hours between eating.  If you eat breakfast, please do so within one hour of getting up.    Each meal should contain half fruits/vegetables, one quarter protein, and one quarter carbs (no bigger than a computer mouse)   Cut down on sweet beverages. This includes juice, soda, and sweet tea.     Drink at least 1 glass of water with each meal and aim for at least 8 glasses per day   Exercise at least 150 minutes every week.    Preventive Care 2-42 Years Old, Female Preventive care refers to lifestyle choices and visits with your health care provider that can promote health and wellness. This includes:  A yearly physical exam. This is also called an annual wellness visit.  Regular dental and eye exams.  Immunizations.  Screening for certain conditions.  Healthy lifestyle choices, such as: ? Eating a healthy diet. ? Getting regular exercise. ? Not using drugs or products that contain nicotine and tobacco. ? Limiting alcohol use. What can I expect for my preventive care visit? Physical exam Your health care provider will check your:  Height and weight. These may be used to calculate your BMI (body mass index). BMI is a measurement that tells if you are at a healthy weight.  Heart rate and blood pressure.  Body temperature.  Skin  for abnormal spots. Counseling Your health care provider may ask you questions about your:  Past medical problems.  Family's medical history.  Alcohol, tobacco, and drug use.  Emotional well-being.  Home life and relationship well-being.  Sexual activity.  Diet, exercise, and sleep habits.  Work and work Statistician.  Access to firearms.  Method of birth control.  Menstrual cycle.  Pregnancy history. What immunizations do I need? Vaccines are usually given at various ages, according to a schedule. Your health care provider will recommend vaccines for you based on your age, medical history, and lifestyle or other factors, such as travel or where you work.   What tests do I need? Blood tests  Lipid and cholesterol levels. These may be checked every 5 years, or more often if you are over 88 years old.  Hepatitis C test.  Hepatitis B test. Screening  Lung cancer screening. You may have this screening every year starting at age 62 if you have a 30-pack-year history of smoking and currently smoke or have quit within the past 15 years.  Colorectal cancer screening. ? All adults should have this screening starting at age 62 and continuing until age 21. ? Your health care provider may recommend screening at age 43 if you are at increased risk. ? You will have tests every 1-10 years, depending on your results and the type of  screening test.  Diabetes screening. ? This is done by checking your blood sugar (glucose) after you have not eaten for a while (fasting). ? You may have this done every 1-3 years.  Mammogram. ? This may be done every 1-2 years. ? Talk with your health care provider about when you should start having regular mammograms. This may depend on whether you have a family history of breast cancer.  BRCA-related cancer screening. This may be done if you have a family history of breast, ovarian, tubal, or peritoneal cancers.  Pelvic exam and Pap test. ? This  may be done every 3 years starting at age 46. ? Starting at age 19, this may be done every 5 years if you have a Pap test in combination with an HPV test. Other tests  STD (sexually transmitted disease) testing, if you are at risk.  Bone density scan. This is done to screen for osteoporosis. You may have this scan if you are at high risk for osteoporosis. Talk with your health care provider about your test results, treatment options, and if necessary, the need for more tests. Follow these instructions at home: Eating and drinking  Eat a diet that includes fresh fruits and vegetables, whole grains, lean protein, and low-fat dairy products.  Take vitamin and mineral supplements as recommended by your health care provider.  Do not drink alcohol if: ? Your health care provider tells you not to drink. ? You are pregnant, may be pregnant, or are planning to become pregnant.  If you drink alcohol: ? Limit how much you have to 0-1 drink a day. ? Be aware of how much alcohol is in your drink. In the [place] one drink equals one 12 oz bottle of beer (355 mL), one 5 oz glass of wine (148 mL), or one 1 oz glass of hard liquor (44 mL).   Lifestyle  Take daily care of your teeth and gums. Brush your teeth every morning and night with fluoride toothpaste. Floss one time each day.  Stay active. Exercise for at least 30 minutes 5 or more days each week.  Do not use any products that contain nicotine or tobacco, such as cigarettes, e-cigarettes, and chewing tobacco. If you need help quitting, ask your health care provider.  Do not use drugs.  If you are sexually active, practice safe sex. Use a condom or other form of protection to prevent STIs (sexually transmitted infections).  If you do not wish to become pregnant, use a form of birth control. If you plan to become pregnant, see your health care provider for a prepregnancy visit.  If told by your health care provider, take low-dose aspirin daily  starting at age 36.  Find healthy ways to cope with stress, such as: ? Meditation, yoga, or listening to music. ? Journaling. ? Talking to a trusted person. ? Spending time with friends and family. Safety  Always wear your seat belt while driving or riding in a vehicle.  Do not drive: ? If you have been drinking alcohol. Do not ride with someone who has been drinking. ? When you are tired or distracted. ? While texting.  Wear a helmet and other protective equipment during sports activities.  If you have firearms in your house, make sure you follow all gun safety procedures. What's next?  Visit your health care provider once a year for an annual wellness visit.  Ask your health care provider how often you should have your eyes and teeth checked.  Stay up to date on all vaccines. This information is not intended to replace advice given to you by your health care provider. Make sure you discuss any questions you have with your health care provider. Document Revised: 11/15/2019 Document Reviewed: 10/22/2017 Elsevier Patient Education  2021 [employer].

## 2020-04-17 NOTE — Assessment & Plan Note (Signed)
She is on iron supplementation and doing well.  We will check iron levels today.

## 2020-04-17 NOTE — Assessment & Plan Note (Signed)
Doing much better on Wellbutrin 300 mg daily and Lexapro 20 mg daily.  We will continue this.

## 2020-04-17 NOTE — Progress Notes (Signed)
Chief Complaint:  Dominique [name] is a 60 y.o. female who presents today for her annual comprehensive physical exam.    Assessment/Plan:  Chronic Problems Addressed Today: Depression, major, single episode, complete remission ([place]) Doing much better on Wellbutrin 300 mg daily and Lexapro 20 mg daily.  We will continue this.  Restless legs syndrome She is on iron supplementation and doing well.  We will check iron levels today.  Low vitamin D level Check vitamin D level.  GERD (gastroesophageal reflux disease) On omeprazole 20 mg daily.  Continue this.  Check B12.  Mild intermittent asthma Refilled albuterol today.  Subclinical hypothyroidism Check TSH.  Continue Synthroid 25 mcg daily.  Benign essential tremor Continue propranolol 60 mg daily.  Mixed hyperlipidemia Check lipids.  Will be starting cholestyramine as above which should help as well.  Loose stools Status post cholecystectomy.  Will start cholestyramine.   Body mass index is 26.35 kg/m. / Overweight  BMI Metric Follow Up - 04/17/20 1526      BMI Metric Follow Up-Please document annually   BMI Metric Follow Up Education provided            Preventative Healthcare: Tdap given today.  Check labs.  Will place order for mammogram.  She will be getting colon cancer screening soon.  Due for Pap in 2024.  Up-to-date on vaccines.   Patient Counseling(The following topics were reviewed and/or handout was given):  -Nutrition: Stressed importance of moderation in sodium/caffeine intake, saturated fat and cholesterol, caloric balance, sufficient intake of fresh fruits, vegetables, and fiber.  -Stressed the importance of regular exercise.   -Substance Abuse: Discussed cessation/primary prevention of tobacco, alcohol, or other drug use; driving or other dangerous activities under the influence; availability of treatment for abuse.   -Injury prevention: Discussed safety belts, safety helmets, smoke detector,  smoking near bedding or upholstery.   -Sexuality: Discussed sexually transmitted diseases, partner selection, use of condoms, avoidance of unintended pregnancy and contraceptive alternatives.   -Dental health: Discussed importance of regular tooth brushing, flossing, and dental visits.  -Health maintenance and immunizations reviewed. Please refer to Health maintenance section.  Return to care in 1 year for next preventative visit.     Subjective:  HPI:  She has no acute complaints today. See A/p for status of chronic conditions.   Lifestyle Diet: Balanced. Trying to get plenty of fruits and vegetables.   Depression screen PHQ 2/9 04/03/2020  Decreased Interest 1  Down, Depressed, Hopeless 1  PHQ - 2 Score 2  Altered sleeping 0  Tired, decreased energy 0  Change in appetite 0  Feeling bad or failure about yourself  1  Trouble concentrating 0  Moving slowly or fidgety/restless 0  Suicidal thoughts 0  PHQ-9 Score 3  Difficult doing work/chores Somewhat difficult    Health Maintenance Due  Topic Date Due  . Hepatitis C Screening  Never done  . COVID-19 Vaccine (3 - Booster for Pfizer series) 12/20/2019     ROS: Per HPI, otherwise a complete review of systems was negative.   PMH:  The following were reviewed and entered/updated in epic: Past Medical History:  Diagnosis Date  . Anemia    on meds  . Anxiety    on meds  . Asthma    uses inhaler as needed  . Blood transfusion without reported diagnosis 1992   post d/c  . Cervix prolapsed into vagina    Uro-Gyne Exam 2011, 2014  . Depression    on meds  .  GERD (gastroesophageal reflux disease)    on meds  . Hearing loss   . High cholesterol    diet controlled-no meds at this time (04/17/2020)  . Migraines    hx of  . Neuromuscular disorder (H[facility])    tremors  . Restless leg syndrome    taking iron to support  . Sleep apnea    uses CPAP  . Thyroid disease    on meds  . Tremor    Patient Active Problem List    Diagnosis Date Noted  . Loose stools 04/17/2020  . Arthritis 01/28/2019  . Asthma, moderate 01/28/2019  . Depression, major, single episode, complete remission ([place]) 12/14/2018  . Subclinical hypothyroidism 08/06/2018  . Mild intermittent asthma 08/06/2018  . GERD (gastroesophageal reflux disease) 08/06/2018  . Low vitamin D level 08/06/2018  . Restless legs syndrome 08/06/2018  . OSA on CPAP 08/06/2018  . Mixed hyperlipidemia 09/09/2017  . Benign essential tremor 09/09/2017  . Anxiety 09/09/2017  . Hearing loss 07/29/2013  . Environmental allergies 04/29/2012  . Heartburn 10/21/2011  . Uterovaginal prolapse 05/06/2011   Past Surgical History:  Procedure Laterality Date  . CHOLECYSTECTOMY N/A 08/14/2019   Procedure: LAPAROSCOPIC CHOLECYSTECTOMY;  Surgeon: Kinsinger, Arta Bruce, MD;  Location: [place] [facility];  Service: General;  Laterality: N/A;  . COLONOSCOPY  2015   with EGD at [facility]  . DILATION AND CURETTAGE OF UTERUS  02/24/1990  . MOUTH SURGERY     3 teeth removed   . ROOT CANAL     2 times   . VAGINAL DELIVERY     1986  . WISDOM TOOTH EXTRACTION      Family History  Problem Relation Age of Onset  . Hypertension Mother   . High Cholesterol Mother   . Cataracts Mother   . Retinal detachment Mother   . Colon cancer Mother 61  . Colon polyps Mother 25  . Hypertension Brother   . High Cholesterol Brother   . Depression Daughter   . Menstrual problems Daughter   . Anxiety disorder Daughter   . ADD / ADHD Son   . Depression Son   . Anxiety disorder Son   . Heart disease Maternal Uncle   . Diabetes Paternal Aunt   . Breast cancer Paternal Aunt   . Diabetes Paternal Grandfather   . Arthritis Paternal Grandfather   . Breast cancer Cousin   . Colon polyps Paternal Grandmother   . Esophageal cancer Neg Hx   . Stomach cancer Neg Hx   . Rectal cancer Neg Hx     Medications- reviewed and updated Current Outpatient Medications  Medication Sig Dispense Refill  . buPROPion  (WELLBUTRIN XL) 300 MG 24 hr tablet TAKE 1 TABLET DAILY 90 tablet 1  . cholestyramine (QUESTRAN) 4 g packet Take 1 packet (4 g total) by mouth 3 (three) times daily with meals. 60 each 12  . escitalopram (LEXAPRO) 20 MG tablet TAKE 1 TABLET DAILY 90 tablet 1  . ferrous sulfate 325 (65 FE) MG tablet Take 325 mg by mouth daily.    Marland Kitchen gabapentin (NEURONTIN) 100 MG capsule Take one capsule($RemoveBeforeDE'100mg'mqORelqVEMJhPqV$ ) nightly 30 capsule 1  . omeprazole (PRILOSEC) 20 MG capsule TAKE 1 CAPSULE DAILY 90 capsule 3  . PEG-KCl-NaCl-NaSulf-Na Asc-C (PLENVU) 140 g SOLR Take 1 kit by mouth as directed. Manufacturer's coupon Universal coupon code:BIN: P2366821; GROUP: [id number]; PCN: [id number]; ID: [id number]; PAY NO MORE $50; NO prior authorization 1 each 0  . propranolol (INDERAL) 60 MG tablet TAKE 1 TABLET  TWICE A DAY 180 tablet 1  . SYNTHROID 25 MCG tablet TAKE 1 TABLET DAILY BEFORE BREAKFAST 90 tablet 1  . albuterol (VENTOLIN HFA) 108 (90 Base) MCG/ACT inhaler Inhale 1 puff into the lungs every 6 (six) hours as needed for wheezing or shortness of breath. 60 g 3   No current facility-administered medications for this visit.    Allergies-reviewed and updated Allergies  Allergen Reactions  . Bactrim [Sulfamethoxazole-Trimethoprim] Rash  . Elemental Sulfur Rash  . Sulfa Antibiotics Rash    Social History   Socioeconomic History  . Marital status: Married    Spouse name: Not on file  . Number of children: 3  . Years of education: Not on file  . Highest education level: Not on file  Occupational History  . Occupation: retired    Comment: pediatrician  Tobacco Use  . Smoking status: Never Smoker  . Smokeless tobacco: Never Used  Vaping Use  . Vaping Use: Never used  Substance and Sexual Activity  . Alcohol use: Yes    Alcohol/week: 3.0 standard drinks    Types: 3 Standard drinks or equivalent per week  . Drug use: No  . Sexual activity: Not Currently  Other Topics Concern  . Not on file  Social History Narrative    As of 11/11/16   Diet: Minimal red meat, lactose intolerance       Caffeine: Yes      Married, if yes what year: Yes, 2006      Do you live in a house, apartment, assisted living, condo, trailer, ect: House, 2 stories, 2 persons      Pets: 1 dog      Current/Past profession: Pediatrician       Exercise: A little, walking dog and elliptical          Living Will: No   DNR: No   POA/HPOA: No      Functional Status:   Do you have difficulty bathing or dressing yourself? No   Do you have difficulty preparing food or eating? No   Do you have difficulty managing your medications? No    Do you have difficulty managing your finances? No    Do you have difficulty affording your medications? No       Right handed   Lives in two story home with husband and dog   Social Determinants of Health   Financial Resource Strain: Not on file  Food Insecurity: Not on file  Transportation Needs: Not on file  Physical Activity: Not on file  Stress: Not on file  Social Connections: Not on file        Objective:  Physical Exam: BP 108/70   Pulse (!) 54   Temp 98.1 F (36.7 C) (Temporal)   Ht 5' 2.5" (1.588 m)   Wt 146 lb 6.4 oz (66.4 kg)   SpO2 97%   BMI 26.35 kg/m   Body mass index is 26.35 kg/m. Wt Readings from Last 3 Encounters:  04/17/20 146 lb 6.4 oz (66.4 kg)  04/17/20 145 lb (65.8 kg)  04/03/20 145 lb 9.6 oz (66 kg)   Gen: NAD, resting comfortably HEENT: TMs normal bilaterally. OP clear. No thyromegaly noted.  CV: RRR with no murmurs appreciated Pulm: NWOB, CTAB with no crackles, wheezes, or rhonchi GI: Normal bowel sounds present. Soft, Nontender, Nondistended. MSK: no edema, cyanosis, or clubbing noted Skin: warm, dry Neuro: CN2-12 grossly intact. Strength 5/5 in upper and lower extremities. Reflexes symmetric and intact bilaterally.  Psych:  Normal affect and thought content     Caleb M. [name], MD 04/17/2020 3:26 PM

## 2020-04-17 NOTE — Assessment & Plan Note (Signed)
Refilled albuterol today. 

## 2020-04-17 NOTE — Progress Notes (Signed)
No egg or soy allergy known to patient  No issues with past sedation with any surgeries or procedures No intubation problems in the past  No FH of Malignant Hyperthermia No diet pills per patient No home 02 use per patient  No blood thinners per patient  Pt denies issues with constipation  No A fib or A flutter  EMMI video via MyChart  COVID 19 guidelines implemented in PV today with Pt and RN  Pt is fully vaccinated for Covid x 2= Pt denies loose or missing teeth; Patient denies dentures, partials, dental implants, capped or bonded teeth; Coupon given to pt in PV today , Code to Pharmacy and  NO PA's for preps discussed with pt In PV today  Discussed with pt there will be an out-of-pocket cost for prep and that varies from $0 to 70 dollars  Due to the COVID-19 pandemic we are asking patients to follow certain guidelines.  Pt aware of COVID protocols and [facility] guidelines

## 2020-04-17 NOTE — Assessment & Plan Note (Signed)
Check TSH.  Continue Synthroid 25 mcg daily. 

## 2020-04-19 ENCOUNTER — Other Ambulatory Visit: Payer: Self-pay | Admitting: Family Medicine

## 2020-04-19 ENCOUNTER — Telehealth: Payer: Self-pay

## 2020-04-19 DIAGNOSIS — N6310 Unspecified lump in the right breast, unspecified quadrant: Secondary | ICD-10-CM

## 2020-04-19 DIAGNOSIS — R928 Other abnormal and inconclusive findings on diagnostic imaging of breast: Secondary | ICD-10-CM

## 2020-04-19 DIAGNOSIS — Z0001 Encounter for general adult medical examination with abnormal findings: Secondary | ICD-10-CM

## 2020-04-19 NOTE — Telephone Encounter (Signed)
See note

## 2020-04-19 NOTE — Telephone Encounter (Signed)
I haven't seen any forms.  Dominique [name]. Dominique Pain, MD 04/19/2020 4:07 PM

## 2020-04-19 NOTE — Telephone Encounter (Signed)
Patient is calling in stating that [facility] sent over a fax last week and [facility] would like an update.

## 2020-04-20 ENCOUNTER — Other Ambulatory Visit (INDEPENDENT_AMBULATORY_CARE_PROVIDER_SITE_OTHER): Payer: BC Managed Care – PPO

## 2020-04-20 ENCOUNTER — Other Ambulatory Visit: Payer: Self-pay

## 2020-04-20 DIAGNOSIS — Z0001 Encounter for general adult medical examination with abnormal findings: Secondary | ICD-10-CM | POA: Diagnosis not present

## 2020-04-20 DIAGNOSIS — E782 Mixed hyperlipidemia: Secondary | ICD-10-CM

## 2020-04-20 DIAGNOSIS — R739 Hyperglycemia, unspecified: Secondary | ICD-10-CM

## 2020-04-20 DIAGNOSIS — G2581 Restless legs syndrome: Secondary | ICD-10-CM

## 2020-04-20 DIAGNOSIS — R7989 Other specified abnormal findings of blood chemistry: Secondary | ICD-10-CM

## 2020-04-20 DIAGNOSIS — Z79899 Other long term (current) drug therapy: Secondary | ICD-10-CM

## 2020-04-20 LAB — CBC
HCT: 37.4 % (ref 36.0–46.0)
Hemoglobin: 12.7 g/dL (ref 12.0–15.0)
MCHC: 33.9 g/dL (ref 30.0–36.0)
MCV: 91.5 fl (ref 78.0–100.0)
Platelets: 148 10*3/uL — ABNORMAL LOW (ref 150.0–400.0)
RBC: 4.09 Mil/uL (ref 3.87–5.11)
RDW: 13.1 % (ref 11.5–15.5)
WBC: 5.7 10*3/uL (ref 4.0–10.5)

## 2020-04-20 LAB — IBC + FERRITIN
Ferritin: 298.4 ng/mL — ABNORMAL HIGH (ref 10.0–291.0)
Iron: 70 ug/dL (ref 42–145)
Saturation Ratios: 26.5 % (ref 20.0–50.0)
Transferrin: 189 mg/dL — ABNORMAL LOW (ref 212.0–360.0)

## 2020-04-20 LAB — LIPID PANEL
Cholesterol: 189 mg/dL (ref 0–200)
HDL: 38.4 mg/dL — ABNORMAL LOW (ref >39.00)
LDL Cholesterol: 115 mg/dL — ABNORMAL HIGH (ref 0–99)
NonHDL: 151.08
Total CHOL/HDL Ratio: 5
Triglycerides: 180 mg/dL — ABNORMAL HIGH (ref 0.0–149.0)
VLDL: 36 mg/dL (ref 0.0–40.0)

## 2020-04-20 LAB — TSH: TSH: 3.9 u[IU]/mL (ref 0.35–4.50)

## 2020-04-20 LAB — COMPREHENSIVE METABOLIC PANEL
ALT: 10 U/L (ref 0–35)
AST: 11 U/L (ref 0–37)
Albumin: 4.1 g/dL (ref 3.5–5.2)
Alkaline Phosphatase: 82 U/L (ref 39–117)
BUN: 14 mg/dL (ref 6–23)
CO2: 31 mEq/L (ref 19–32)
Calcium: 9.1 mg/dL (ref 8.4–10.5)
Chloride: 106 mEq/L (ref 96–112)
Creatinine, Ser: 0.94 mg/dL (ref 0.40–1.20)
GFR: 66.53 mL/min (ref >60.00)
Glucose, Bld: 91 mg/dL (ref 70–99)
Potassium: 4.6 mEq/L (ref 3.5–5.1)
Sodium: 143 mEq/L (ref 135–145)
Total Bilirubin: 0.5 mg/dL (ref 0.2–1.2)
Total Protein: 6 g/dL (ref 6.0–8.3)

## 2020-04-20 LAB — VITAMIN D 25 HYDROXY (VIT D DEFICIENCY, FRACTURES): VITD: 28.75 ng/mL — ABNORMAL LOW (ref 30.00–100.00)

## 2020-04-20 LAB — VITAMIN B12: Vitamin B-12: 246 pg/mL (ref 211–911)

## 2020-04-20 LAB — HEMOGLOBIN A1C: Hgb A1c MFr Bld: 5.3 % (ref 4.6–6.5)

## 2020-04-23 NOTE — Progress Notes (Signed)
Please inform patient of the following:  Vitamin D is a bit low. Everything else is stable. I would like for her to make sure she is taking 1000-2000IU of vitamin D daily and we can recheck again in 6-12 months.  We can recheck everything else is a year.

## 2020-04-24 ENCOUNTER — Encounter: Payer: Self-pay | Admitting: Family Medicine

## 2020-04-24 ENCOUNTER — Encounter: Payer: Self-pay | Admitting: Gastroenterology

## 2020-04-26 NOTE — Telephone Encounter (Signed)
Please advise 

## 2020-05-01 ENCOUNTER — Ambulatory Visit (AMBULATORY_SURGERY_CENTER): Payer: BC Managed Care – PPO | Admitting: Gastroenterology

## 2020-05-01 ENCOUNTER — Encounter: Payer: Self-pay | Admitting: Gastroenterology

## 2020-05-01 ENCOUNTER — Other Ambulatory Visit: Payer: Self-pay

## 2020-05-01 VITALS — BP 105/61 | HR 42 | Temp 97.3°F | Resp 18 | Ht 62.25 in | Wt 145.0 lb

## 2020-05-01 DIAGNOSIS — Z1211 Encounter for screening for malignant neoplasm of colon: Secondary | ICD-10-CM | POA: Diagnosis not present

## 2020-05-01 DIAGNOSIS — D122 Benign neoplasm of ascending colon: Secondary | ICD-10-CM

## 2020-05-01 DIAGNOSIS — Z800 Family history of malignant neoplasm of digestive organs: Secondary | ICD-10-CM | POA: Diagnosis not present

## 2020-05-01 DIAGNOSIS — D120 Benign neoplasm of cecum: Secondary | ICD-10-CM | POA: Diagnosis not present

## 2020-05-01 MED ORDER — SODIUM CHLORIDE 0.9 % IV SOLN: 500.0000 mL | Freq: Once | INTRAVENOUS | Status: DC

## 2020-05-01 NOTE — Patient Instructions (Signed)
Information on polyps given to you today.  Await pathology results.  Resume previous diet and medications.  YOU HAD AN ENDOSCOPIC PROCEDURE TODAY AT THE [facility]:   Refer to the procedure report that was given to you for any specific questions about what was found during the examination.  If the procedure report does not answer your questions, please call your gastroenterologist to clarify.  If you requested that your care partner not be given the details of your procedure findings, then the procedure report has been included in a sealed envelope for you to review at your convenience later.  YOU SHOULD EXPECT: Some feelings of bloating in the abdomen. Passage of more gas than usual.  Walking can help get rid of the air that was put into your GI tract during the procedure and reduce the bloating. If you had a lower endoscopy (such as a colonoscopy or flexible sigmoidoscopy) you may notice spotting of blood in your stool or on the toilet paper. If you underwent a bowel prep for your procedure, you may not have a normal bowel movement for a few days.  Please Note:  You might notice some irritation and congestion in your nose or some drainage.  This is from the oxygen used during your procedure.  There is no need for concern and it should clear up in a day or so.  SYMPTOMS TO REPORT IMMEDIATELY:   Following lower endoscopy (colonoscopy or flexible sigmoidoscopy):  Excessive amounts of blood in the stool  Significant tenderness or worsening of abdominal pains  Swelling of the abdomen that is new, acute  Fever of 100F or higher   For urgent or emergent issues, a gastroenterologist can be reached at any hour by calling [phone]. Do not use MyChart messaging for urgent concerns.    DIET:  We do recommend a small meal at first, but then you may proceed to your regular diet.  Drink plenty of fluids but you should avoid alcoholic beverages for 24 hours.  ACTIVITY:  You should  plan to take it easy for the rest of today and you should NOT DRIVE or use heavy machinery until tomorrow (because of the sedation medicines used during the test).    FOLLOW UP: Our staff will call the number listed on your records 48-72 hours following your procedure to check on you and address any questions or concerns that you may have regarding the information given to you following your procedure. If we do not reach you, we will leave a message.  We will attempt to reach you two times.  During this call, we will ask if you have developed any symptoms of COVID 19. If you develop any symptoms (ie: fever, flu-like symptoms, shortness of breath, cough etc.) before then, please call [phone].  If you test positive for Covid 19 in the 2 weeks post procedure, please call and report this information to us.    If any biopsies were taken you will be contacted by phone or by letter within the next 1-3 weeks.  Please call us at [phone] if you have not heard about the biopsies in 3 weeks.    SIGNATURES/CONFIDENTIALITY: You and/or your care partner have signed paperwork which will be entered into your electronic medical record.  These signatures attest to the fact that that the information above on your After Visit Summary has been reviewed and is understood.  Full responsibility of the confidentiality of this discharge information lies with you and/or your care-partner. 

## 2020-05-01 NOTE — Op Note (Signed)
[facility] Patient Name: Dominique [name] Procedure Date: 05/01/2020 9:35 AM MRN: [id number] Endoscopist: Mallie Mussel L. Loletha Carrow , MD Age: 60 Referring MD:  Date of Birth: Nov 09, 1960 Gender: Female Account #: [id number] Procedure:                Colonoscopy Indications:              Screening in patient at increased risk: Colorectal                            cancer in mother 60 or older (last colonoscopy nml                            2013 at outside institution, mother Dx about 1.5                            yrs ago) Medicines:                Monitored Anesthesia Care Procedure:                Pre-Anesthesia Assessment:                           - Prior to the procedure, a History and Physical                            was performed, and patient medications and                            allergies were reviewed. The patient's tolerance of                            previous anesthesia was also reviewed. The risks                            and benefits of the procedure and the sedation                            options and risks were discussed with the patient.                            All questions were answered, and informed consent                            was obtained. Prior Anticoagulants: The patient has                            taken no previous anticoagulant or antiplatelet                            agents. ASA Grade Assessment: II - A patient with                            mild systemic disease. After reviewing the risks  and benefits, the patient was deemed in                            satisfactory condition to undergo the procedure.                           After obtaining informed consent, the colonoscope                            was passed under direct vision. Throughout the                            procedure, the patient's blood pressure, pulse, and                            oxygen saturations were monitored continuously. The                             Olympus CF-HQ190L [id number]) Colonoscope was                            introduced through the anus and advanced to the the                            cecum, identified by appendiceal orifice and                            ileocecal valve. The colonoscopy was performed                            without difficulty. The patient tolerated the                            procedure well. The quality of the bowel                            preparation was good after lavage. The ileocecal                            valve, appendiceal orifice, and rectum were                            photographed. The bowel preparation used was Plenvu. Scope In: 9:49:53 AM Scope Out: 10:08:56 AM Scope Withdrawal Time: 0 hours 14 minutes 13 seconds  Total Procedure Duration: 0 hours 19 minutes 3 seconds  Findings:                 The perianal and digital rectal examinations were                            normal.                           Three flat polyps were found in the ascending colon  and cecum. The polyps were diminutive in size.                            These polyps were removed with a cold biopsy                            forceps. Resection and retrieval were complete.                           The exam was otherwise without abnormality on                            direct and retroflexion views. Complications:            No immediate complications. Estimated Blood Loss:     Estimated blood loss was minimal. Impression:               - Three diminutive polyps in the ascending colon                            and in the cecum, removed with a cold biopsy                            forceps. Resected and retrieved.                           - The examination was otherwise normal on direct                            and retroflexion views. Recommendation:           - Patient has a contact number available for                            emergencies. The  signs and symptoms of potential                            delayed complications were discussed with the                            patient. Return to normal activities tomorrow.                            Written discharge instructions were provided to the                            patient.                           - Resume previous diet.                           - Continue present medications.                           - Await pathology results.                           -  Repeat colonoscopy is recommended for                            surveillance. The colonoscopy date will be                            determined after pathology results from today's                            exam become available for review. Adlai Nieblas L. Loletha Carrow, MD 05/01/2020 10:12:39 AM This report has been signed electronically.

## 2020-05-01 NOTE — Progress Notes (Signed)
Called to room to assist during endoscopic procedure.  Patient ID and intended procedure confirmed with present staff. Received instructions for my participation in the procedure from the performing physician.  

## 2020-05-01 NOTE — Progress Notes (Signed)
Vs by CW in a  Pt's states no medical or surgical changes since previsit or office visit.dm

## 2020-05-01 NOTE — Progress Notes (Signed)
To PACU< VSS. Report to Rn.tb 

## 2020-05-03 ENCOUNTER — Telehealth: Payer: Self-pay | Admitting: *Deleted

## 2020-05-03 ENCOUNTER — Telehealth: Payer: Self-pay

## 2020-05-03 NOTE — Telephone Encounter (Signed)
  Follow up Call-  Call back number 05/01/2020  Post procedure Call Back phone  # [phone]  Permission to leave phone message Yes  Some recent data might be hidden     Patient questions:  Do you have a fever, pain , or abdominal swelling? No. Pain Score  0 *  Have you tolerated food without any problems? Yes.    Have you been able to return to your normal activities? Yes.    Do you have any questions about your discharge instructions: Diet   No. Medications  No. Follow up visit  No.  Do you have questions or concerns about your Care? No.  Actions: * If pain score is 4 or above: No action needed, pain <4.  1. Have you developed a fever since your procedure? no  2.   Have you had an respiratory symptoms (SOB or cough) since your procedure? no  3.   Have you tested positive for COVID 19 since your procedure no  4.   Have you had any family members/close contacts diagnosed with the COVID 19 since your procedure?  no   If yes to any of these questions please route to Joylene John, RN and Joella Prince, RN

## 2020-05-03 NOTE — Telephone Encounter (Signed)
First attempt follow up call to pt, lm on vm 

## 2020-05-07 ENCOUNTER — Encounter: Payer: Self-pay | Admitting: Gastroenterology

## 2020-05-13 ENCOUNTER — Other Ambulatory Visit: Payer: Self-pay | Admitting: Family Medicine

## 2020-06-06 ENCOUNTER — Ambulatory Visit
Admission: RE | Admit: 2020-06-06 | Discharge: 2020-06-06 | Disposition: A | Discharge status: Home / Self Care | Payer: BC Managed Care – PPO | Source: Ambulatory Visit | Attending: Family Medicine | Admitting: Family Medicine

## 2020-06-06 ENCOUNTER — Other Ambulatory Visit: Payer: Self-pay

## 2020-06-06 DIAGNOSIS — N6310 Unspecified lump in the right breast, unspecified quadrant: Secondary | ICD-10-CM

## 2020-06-15 ENCOUNTER — Other Ambulatory Visit: Payer: Self-pay | Admitting: Family Medicine

## 2020-06-15 DIAGNOSIS — Z7184 Encounter for health counseling related to travel: Secondary | ICD-10-CM

## 2020-06-18 ENCOUNTER — Encounter: Payer: Self-pay | Admitting: Family Medicine

## 2020-06-18 ENCOUNTER — Telehealth: Payer: Self-pay

## 2020-06-18 NOTE — Telephone Encounter (Signed)
Dominique [name] [facility] in from [facility], they received a referral for the mobile clinic but due to covid they have not done it.  

## 2020-06-18 NOTE — Telephone Encounter (Signed)
See note

## 2020-06-18 NOTE — Telephone Encounter (Signed)
Is this for the travel medicine referral?  If so do they have any alternative recommendations?  Algis Greenhouse. [name], MD 06/18/2020 11:06 AM

## 2020-06-18 NOTE — Telephone Encounter (Signed)
Patient will contact health department

## 2020-06-19 NOTE — Telephone Encounter (Signed)
Noted. Would like for them to let us know if they need any further assistance. We can send in the diamox as requested in the [facility] message.  Algis Greenhouse. [name], MD 06/19/2020 11:47 AM

## 2020-06-21 ENCOUNTER — Encounter: Payer: Self-pay | Admitting: Family Medicine

## 2020-06-21 ENCOUNTER — Other Ambulatory Visit: Payer: Self-pay

## 2020-06-21 ENCOUNTER — Other Ambulatory Visit: Payer: Self-pay | Admitting: Family Medicine

## 2020-06-21 MED ORDER — ACETAZOLAMIDE 125 MG PO TABS: 125.0000 mg | ORAL_TABLET | Freq: Two times a day (BID) | ORAL | 0 refills | Status: DC

## 2020-06-21 MED ORDER — GABAPENTIN 100 MG PO CAPS: ORAL_CAPSULE | ORAL | 1 refills | Status: DC

## 2020-06-21 NOTE — Telephone Encounter (Signed)
Rx sent in patient notified 

## 2020-06-27 ENCOUNTER — Other Ambulatory Visit: Payer: Self-pay | Admitting: Family Medicine

## 2020-07-14 ENCOUNTER — Other Ambulatory Visit: Payer: Self-pay | Admitting: Family Medicine

## 2020-07-22 ENCOUNTER — Other Ambulatory Visit: Payer: Self-pay | Admitting: Physician Assistant

## 2020-08-02 ENCOUNTER — Other Ambulatory Visit: Payer: Self-pay | Admitting: Family Medicine

## 2020-08-23 IMAGING — CT CT HEART SCORING
2 series · 16 of 20 positions shown, 18 images · non-contrast
Comparison: None.

Addendum:
EXAM:
OVER-READ INTERPRETATION  CT CHEST

The following report is an over-read performed by radiologist Dr.
Delfina Ferri [REDACTED] on 01/09/2018. This over-read
does not include interpretation of cardiac or coronary anatomy or
pathology. The coronary calcium score/coronary CTA interpretation by
the cardiologist is attached.
CLINICAL DATA: Risk stratification
Coronary Calcium Score
TECHNIQUE: The patient was scanned on a Siemens Force scanner. Axial
non-contrast 3 mm slices were carried out through the heart. The
data set was analyzed on a dedicated work station and scored using
the Agatson method.

[Series 2: casc 3.0 i36f 2 bestdiast 70 % · axial · 0.38mm/px · z∈[-249,-153]mm · 8 of 42 slices shown, 10 images]
[im 5/42  vessel]
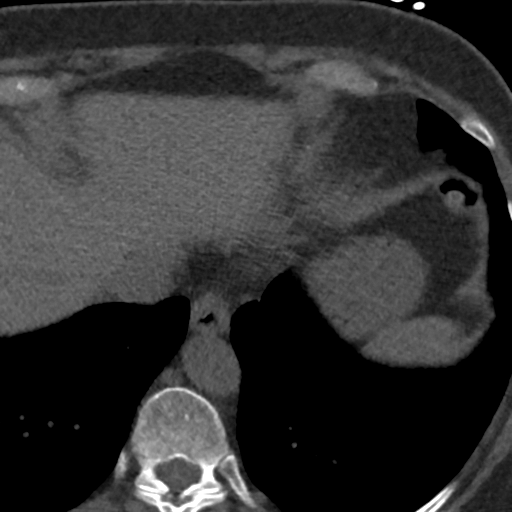
[im 5/42  lung]
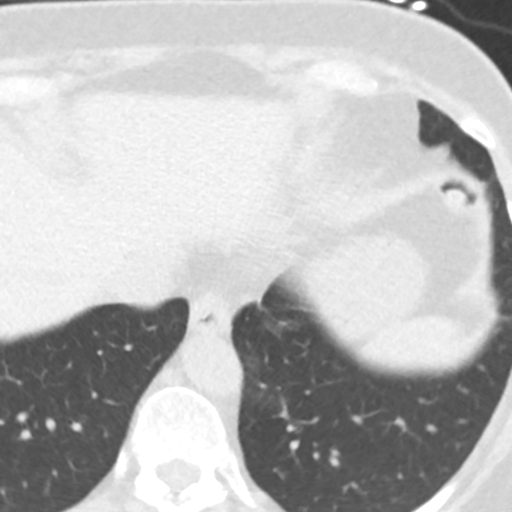
[im 10/42  vessel]
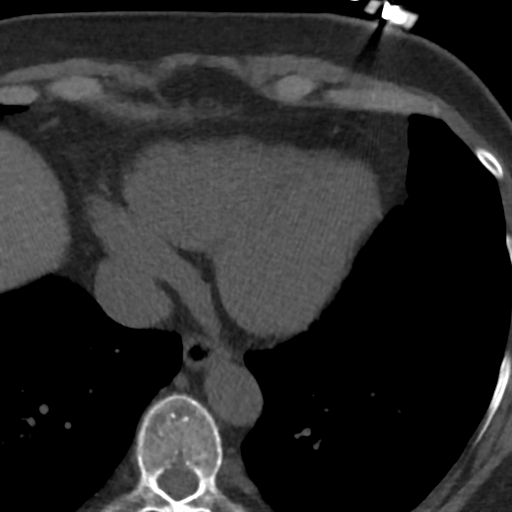
[im 14/42  vessel]
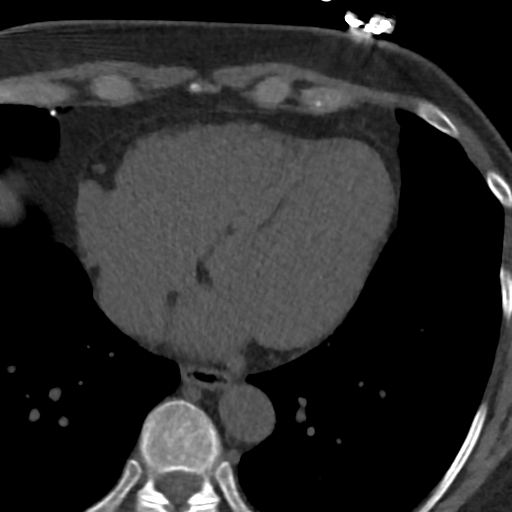
[im 19/42  vessel]
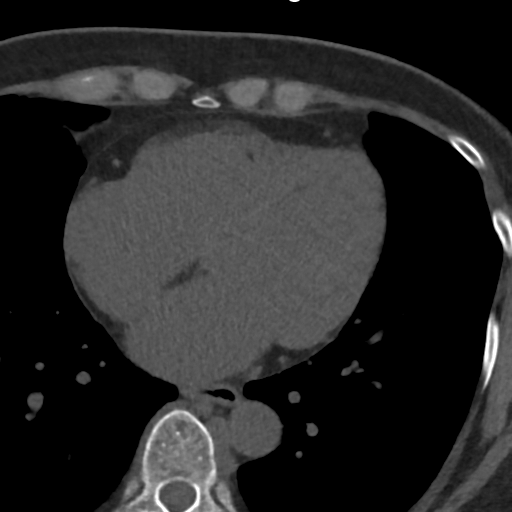
[im 23/42  vessel]
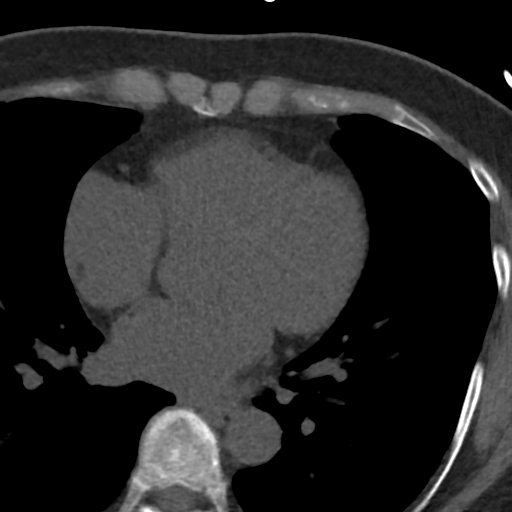
[im 23/42  lung]
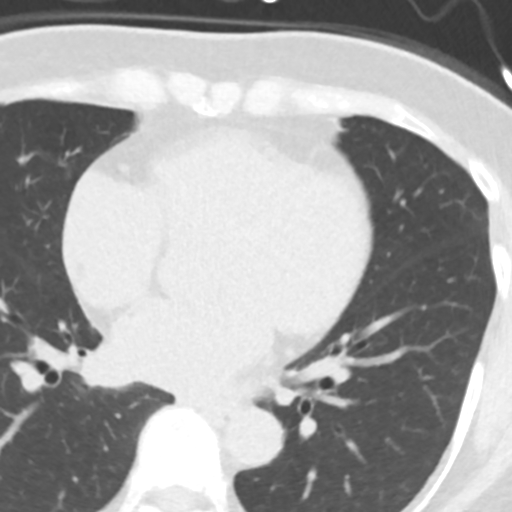
[im 28/42  vessel]
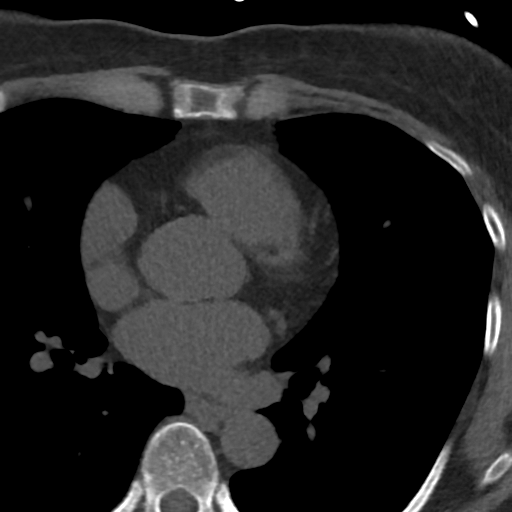
[im 32/42  vessel]
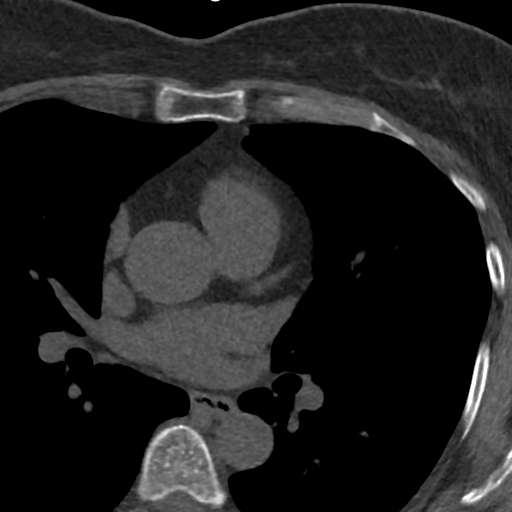
[im 37/42  vessel]
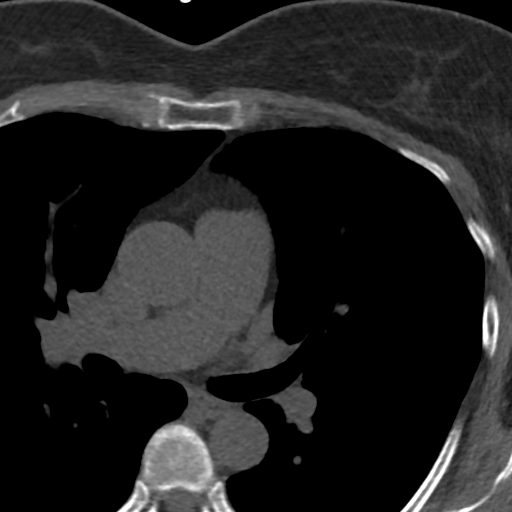

[Series 4: lung st 71 % · axial · 0.65mm/px · z∈[-248,-152]mm · 8 of 42 slices shown]
[im 5/42  lung]
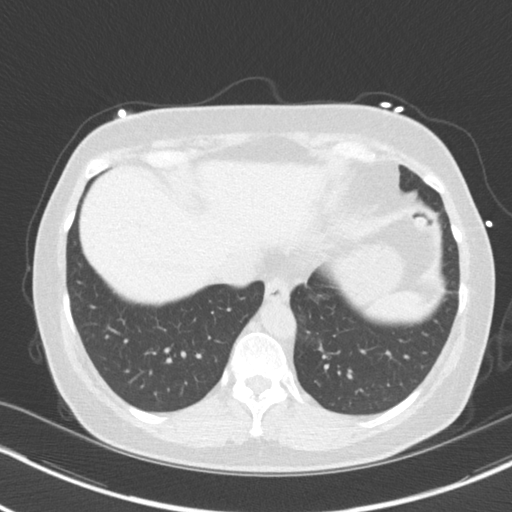
[im 10/42  lung]
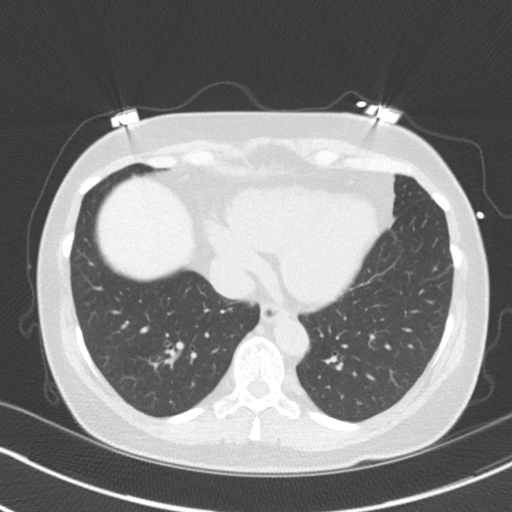
[im 14/42  lung]
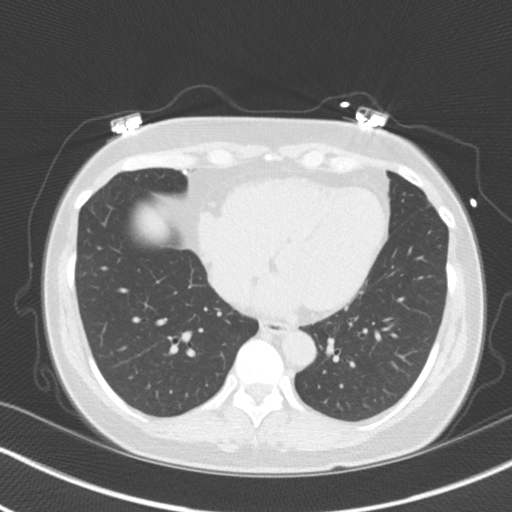
[im 19/42  lung]
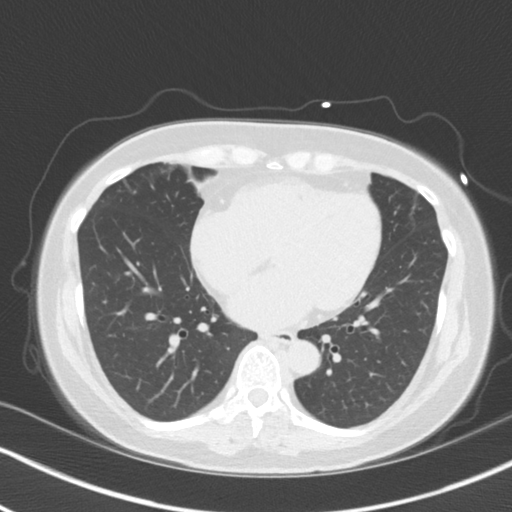
[im 23/42  lung]
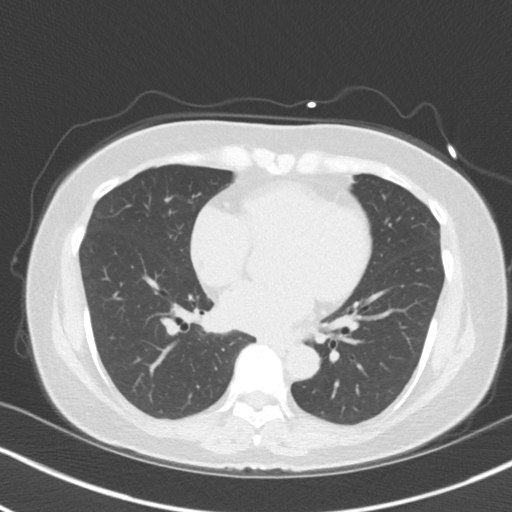
[im 28/42  lung]
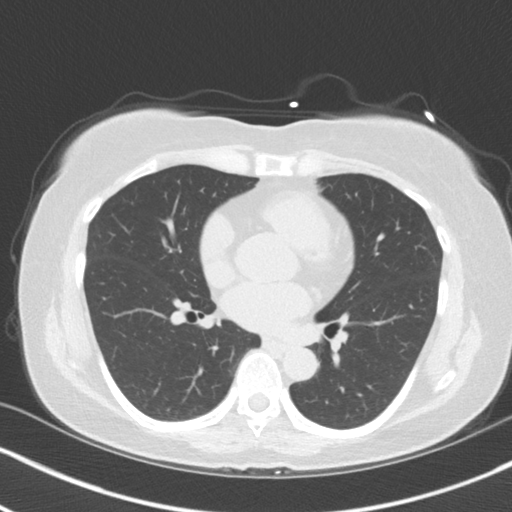
[im 32/42  lung]
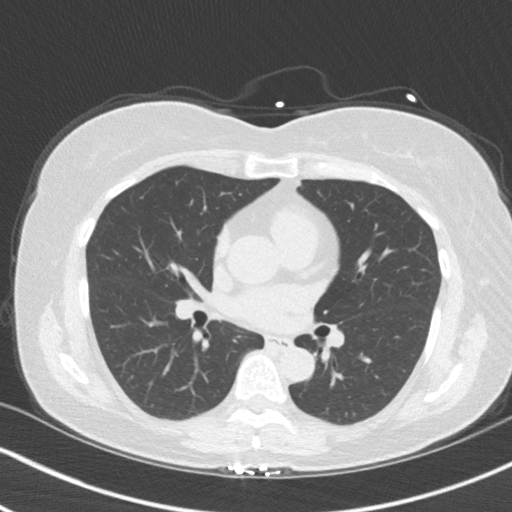
[im 37/42  lung]
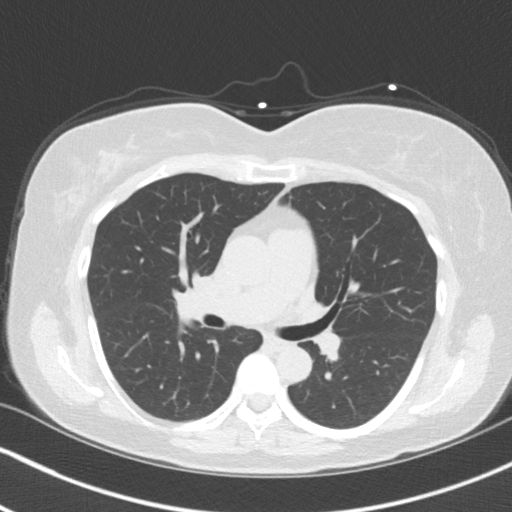

[16 of 20 positions shown; findings below may reference images not displayed]

FINDINGS: Vascular: Normal caliber of the visualized thoracic aorta without
calcifications. Heart size is normal without significant pericardial
fluid.

Mediastinum/Nodes: Visualized mediastinum and hilar structures are
unremarkable.

Lungs/Pleura: Lungs are clear.  No large pleural effusions.

Upper Abdomen: Unremarkable

Musculoskeletal: Bone structures are unremarkable.
IMPRESSION: Negative over-read examination.
FINDINGS: Non-cardiac: See separate report from [REDACTED].

Ascending Aorta: Normal size, no calcifications.

Pericardium: Normal.

Coronary arteries: Normal origin.
IMPRESSION: Coronary calcium score of 0. This was 0 percentile for age and sex
matched control.

*** End of Addendum ***

## 2020-09-18 ENCOUNTER — Encounter: Payer: Self-pay | Admitting: Family Medicine

## 2020-09-18 ENCOUNTER — Other Ambulatory Visit: Payer: Self-pay

## 2020-09-18 ENCOUNTER — Ambulatory Visit: Payer: BC Managed Care – PPO | Admitting: Family Medicine

## 2020-09-18 VITALS — BP 94/55 | HR 55 | Temp 97.9°F | Ht 62.25 in | Wt 144.4 lb

## 2020-09-18 DIAGNOSIS — M79644 Pain in right finger(s): Secondary | ICD-10-CM | POA: Diagnosis not present

## 2020-09-18 DIAGNOSIS — Z9109 Other allergy status, other than to drugs and biological substances: Secondary | ICD-10-CM

## 2020-09-18 MED ORDER — CHOLESTYRAMINE 4 G PO PACK: 4.0000 g | PACK | Freq: Every day | ORAL | 12 refills | Status: DC

## 2020-09-18 NOTE — Patient Instructions (Signed)
It was very nice to see you today!  Please keep your finger buddy taped and splinted for the next couple weeks is much as possible.  We will get an x-ray to make sure there are no fractures.  Take care, Dr [name]  PLEASE NOTE:  If you had any lab tests please let us know if you have not heard back within a few days. You may see your results on mychart before we have a chance to review them but we will give you a call once they are reviewed by [place]. If we ordered any referrals today, please let us know if you have not heard from their office within the next week.   Please try these tips to maintain a healthy lifestyle:  Eat at least 3 REAL meals and 1-2 snacks per day.  Aim for no more than 5 hours between eating.  If you eat breakfast, please do so within one hour of getting up.   Each meal should contain half fruits/vegetables, one quarter protein, and one quarter carbs (no bigger than a computer mouse)  Cut down on sweet beverages. This includes juice, soda, and sweet tea.   Drink at least 1 glass of water with each meal and aim for at least 8 glasses per day  Exercise at least 150 minutes every week.

## 2020-09-18 NOTE — Progress Notes (Signed)
   Dominique [name] is a 60 y.o. female who presents today for an office visit.  Assessment/Plan:  New/Acute Problems: Right finger [name] Check x-ray to rule out fracture.  Possibly soft tissue or ligamentous injury to her DIP joint.  Finger was splinted and buddy tape today.  Advised her to do this is much as possible over the next couple of weeks.  If she has a displaced fracture on x-ray will need referral to orthopedics.  If symptoms or not improving over the next couple weeks with conservative measures would consider referral to sports med or orthopedics.  She can use OTC analgesics as needed for [name].   Skin lesion Likely secondary to rhinitis and frequent rubbing of the area  She feels like her allergies are well controlled.  Recommend that she try a little bit of zinc oxide to the area to see if this helps.    Subjective:  HPI:  Patient here with right fifth digit [name].  Started 6 weeks ago.  Occurred while she was carrying groceries.  Feels like she may have had too many groceries in her hand.  [name] has been persistent for the last several weeks.  Has noticed some swelling to the PIP joint.  [name] with pressing on the area.  Symptoms are overall stable.  She also has a small sore under her nose due to runny nose from allergies.  She has tried using mupirocin without significant improvement.       Objective:  Physical Exam: There were no vitals taken for this visit.  Gen: No acute distress, resting comfortably CV: Regular rate and rhythm with no murmurs appreciated Pulm: Normal work of breathing, clear to auscultation bilaterally with no crackles, wheezes, or rhonchi MSK: Right fifth digit with effusion on PIP joint.  Tender to palpation.  Neurovascular intact distally. Neuro: Grossly normal, moves all extremities Psych: Normal affect and thought content      Dominique [name] M. Dominique Pain, MD 09/18/2020 2:08 PM

## 2020-09-19 ENCOUNTER — Ambulatory Visit (HOSPITAL_COMMUNITY)
Admission: RE | Admit: 2020-09-19 | Discharge: 2020-09-19 | Disposition: A | Discharge status: Home / Self Care | Payer: BC Managed Care – PPO | Source: Ambulatory Visit | Attending: Family Medicine | Admitting: Family Medicine

## 2020-09-19 DIAGNOSIS — M79644 Pain in right finger(s): Secondary | ICD-10-CM | POA: Insufficient documentation

## 2020-09-21 ENCOUNTER — Encounter: Payer: Self-pay | Admitting: Family Medicine

## 2020-09-21 NOTE — Progress Notes (Signed)
Please inform patient of the following:  Good news! Xray shows no fracture. Would like for her to keep her finger splinted for a nother week or as much as possible and let us know if it is not improving.  Algis Greenhouse. [name], MD 09/21/2020 3:39 PM

## 2020-10-04 ENCOUNTER — Other Ambulatory Visit: Payer: Self-pay

## 2020-10-04 ENCOUNTER — Encounter: Payer: Self-pay | Admitting: Family Medicine

## 2020-10-04 DIAGNOSIS — M79644 Pain in right finger(s): Secondary | ICD-10-CM

## 2020-10-11 NOTE — Progress Notes (Signed)
Subjective:    I'm seeing this patient as a consultation for:  Dr. [name]. Note will be routed back to referring provider/PCP.  CC: R 5th finger pain  I, Molly Weber, LAT, ATC, am serving as scribe for Dr. Lynne Leader.  HPI: Pt is a 60 y/o female presenting w/ R 5th finger pain since June 2022 when she noticed pain after carrying a lot of grocery bags.  She locates her pain to R 5th MCP and PIP joints.  R 5th finger swelling: yes Aggravating factors: gripping Treatments tried: buddy taping w/ splint- but this worsened pain  Diagnostic testing: R 5th finger XR- 09/19/20  Past medical history, Surgical history, Family history, Social history, Allergies, and medications have been entered into the medical record, reviewed.   Review of Systems: No new headache, visual changes, nausea, vomiting, diarrhea, constipation, dizziness, abdominal pain, skin rash, fevers, chills, night sweats, weight loss, swollen lymph nodes, body aches, joint swelling, muscle aches, chest pain, shortness of breath, mood changes, visual or auditory hallucinations.   Objective:    Vitals:   10/16/20 0818  BP: 122/82  Pulse: (!) 52  SpO2: 98%   General: Well Developed, well nourished, and in no acute distress.  Neuro/Psych: Alert and oriented x3, extra-ocular muscles intact, able to move all 4 extremities, sensation grossly intact. Skin: Warm and dry, no rashes noted.  Respiratory: Not using accessory muscles, speaking in full sentences, trachea midline.  Cardiovascular: Pulses palpable, no extremity edema. Abdomen: Does not appear distended. MSK: Right hand slightly swollen fifth digit PIP.  Mildly tender palpation.  Slight decreased flexion PIP otherwise motion is normal throughout hand. Strength is intact throughout fifth digit flexion and extension MCP PIP and DIP.  No laxity to ulnar or radial stress. Cap refill and sensation are intact distally.  Lab and Radiology Results  Diagnostic Limited MSK  Ultrasound of: Right hand fifth digit Joint effusion present at fifth digit PIP worse at radial aspect.  Intact radial collateral and ulnar collateral ligament.  Normal-appearing bony structures.  Intact extensor and flexor tendons. Normal-appearing MCP and DIP. Impression: Joint effusion right fifth PIP  EXAM: RIGHT LITTLE FINGER 2+V   COMPARISON:  None.   FINDINGS: There is no evidence of fracture or dislocation. There is no evidence of arthropathy or other focal bone abnormality. Soft tissues are unremarkable.   IMPRESSION: Negative.     Electronically Signed   By: Macy Mis M.D.   On: 09/21/2020 14:42 I, Lynne Leader, personally (independently) visualized and performed the interpretation of the images attached in this note.   Impression and Recommendations:    Assessment and Plan: 60 y.o. female with right fifth finger injury worse at PIP.  This occurred about 3 months ago and is still painful and swollen with some limited motion.  Concern for traumatic synovitis.. She has not improved on her own.  Plan for limited home exercise program teaching today, and Voltaren gel however most important benefit will be referral to hand PT.  Recheck in 6 to 8 weeks.  If not better would consider trial of injection.  PDMP not reviewed this encounter. Orders Placed This Encounter  Procedures   [place] LIMITED JOINT SPACE STRUCTURES UP RIGHT(NO LINKED CHARGES)    Standing Status:   Future    Number of Occurrences:   1    Standing Expiration Date:   04/18/2021    Order Specific Question:   Reason for Exam (SYMPTOM  OR DIAGNOSIS REQUIRED)    Answer:  right hand pain    Order Specific Question:   Preferred imaging location?    Answer:   [place]   Ambulatory referral to Physical Therapy    Referral Priority:   Routine    Referral Type:   Physical Medicine    Referral Reason:   Specialty Services Required    Requested Specialty:   Physical Therapy    Number of  Visits Requested:   1   No orders of the defined types were placed in this encounter.   Discussed warning signs or symptoms. Please see discharge instructions. Patient expresses understanding.   The above documentation has been reviewed and is accurate and complete Lynne Leader, M.D.

## 2020-10-16 ENCOUNTER — Ambulatory Visit: Payer: BC Managed Care – PPO | Admitting: Family Medicine

## 2020-10-16 ENCOUNTER — Ambulatory Visit: Payer: Self-pay

## 2020-10-16 ENCOUNTER — Encounter: Payer: Self-pay | Admitting: Family Medicine

## 2020-10-16 ENCOUNTER — Other Ambulatory Visit: Payer: Self-pay

## 2020-10-16 VITALS — BP 122/82 | HR 52 | Ht 62.25 in | Wt 145.6 lb

## 2020-10-16 DIAGNOSIS — M79641 Pain in right hand: Secondary | ICD-10-CM

## 2020-10-16 NOTE — Patient Instructions (Signed)
Thank you for coming in today.   I've referred you to Physical Therapy.  Let us know if you don't hear from them in one week.   Please use Voltaren gel (Generic Diclofenac Gel) up to 4x daily for pain as needed.  This is available over-the-counter as both the name brand Voltaren gel and the generic diclofenac gel.   Heat often helps.   Buddy tape the 5th digit with heavy activity  Recheck in 6-8 weeks.   Let me know if you have a problem.   Marland Kitchen

## 2020-10-29 ENCOUNTER — Other Ambulatory Visit: Payer: Self-pay | Admitting: Family Medicine

## 2020-10-29 DIAGNOSIS — K219 Gastro-esophageal reflux disease without esophagitis: Secondary | ICD-10-CM

## 2020-11-27 ENCOUNTER — Encounter: Payer: Self-pay | Admitting: Family Medicine

## 2020-12-03 NOTE — Progress Notes (Signed)
I, Wendy Poet, LAT, ATC, am serving as scribe for Dr. Lynne Leader.  Dominique [name] is a 60 y.o. female who presents to [place] at [facility] today for f/u R 5th finger pain concerning for traumatic synovitis. Pt was last seen by Dr. Georgina Snell on 10/16/20 and was taught HEP, advised to use Voltaren gel, and was referred to [facility] for hand therapy. Today, pt reports that her R 5th finger is about the same.  She has completed 6 hand therapy sessions.  She has been doing her HEP per PT fairly consistently.  Her biggest issue is her inability to grip normally.  She feels her ROM has improved w/ PT but her pain is about the same.  Dx imaging: 09/19/20 R 5th finger XR   Additionally she notes right anterior knee pain.  She has been working with a Physiological scientist to improve her quadricep strength which has helped.  However she notes crepitation and anterior knee pain especially when she climbs stairs and sits for prolonged period of time.  She is interested in an x-ray and potential next steps if needed.  Pertinent review of systems: No fevers or chills  Relevant historical information: Depression.  Vitamin D deficiency.   Exam:  BP 102/70 (BP Location: Left Arm, Patient Position: Sitting, Cuff Size: Normal)   Pulse (!) 58   Ht 5' 2.25" (1.581 m)   Wt 149 lb 3.2 oz (67.7 kg)   SpO2 96%   BMI 27.07 kg/m  General: Well Developed, well nourished, and in no acute distress.   MSK: Right hand slight swelling at radial aspect fifth PIP. Minimally tender palpation fifth PIP. Normal hand motion including at fifth PIP.  Strength is intact. Stable ligamentous exam at fifth PIP.  Right knee slight decreased VMO bulk otherwise normal-appearing. Normal motion with retropatellar crepitation. Stable ligamentous exam. Intact strength. Negative Murray's test.    Lab and Radiology Results  X-ray images right knee obtained today personally and independently interpreted Mild  medial DJD.  Mild patellofemoral DJD.  Osteophyte superior patellar pole.  No acute fractures. Await formal radiology review    EXAM: RIGHT LITTLE FINGER 2+V   COMPARISON:  None.   FINDINGS: There is no evidence of fracture or dislocation. There is no evidence of arthropathy or other focal bone abnormality. Soft tissues are unremarkable.   IMPRESSION: Negative.     Electronically Signed   By: Macy Mis M.D.   On: 09/21/2020 14:42 I, Lynne Leader, personally (independently) visualized and performed the interpretation of the images attached in this note.    Assessment and Plan: 60 y.o. female with right fifth digit PIP pain.  Pain ongoing since at least July of this year.  Patient has had extensive treatment including dedicated hand PT.  She has had improvement in range of motion but still has some discomfort and pain.  We discussed options.  She like to have a trial of oral NSAIDs first before proceeding with steroid injection.  I think this is reasonable.  Plan for trial of oral naproxen.  She does already have an existing prescription of omeprazole which should help to protect her stomach.  Recheck in a month if not better would consider intra-articular injection at fifth PIP.  Additionally she notes some right anterior knee pain thought to be due to patellofemoral chondromalacia/patellofemoral pain syndrome.  X-ray today showed a mild amount of DJD.  However radiology overread is still pending.  Again continued quad strengthening and trial  of oral NSAIDs.  Recheck in a month.   PDMP not reviewed this encounter. Orders Placed This Encounter  Procedures   DG Knee AP/LAT W/Sunrise Right    Standing Status:   Future    Number of Occurrences:   1    Standing Expiration Date:   01/04/2021    Order Specific Question:   Reason for Exam (SYMPTOM  OR DIAGNOSIS REQUIRED)    Answer:   R knee pain    Order Specific Question:   Is patient pregnant?    Answer:   No    Order Specific  Question:   Preferred imaging location?    Answer:   [facility]   Meds ordered this encounter  Medications   naproxen (NAPROSYN) 375 MG tablet    Sig: Take 1 tablet (375 mg total) by mouth 2 (two) times daily with a meal.    Dispense:  60 tablet    Refill:  1     Discussed warning signs or symptoms. Please see discharge instructions. Patient expresses understanding.   The above documentation has been reviewed and is accurate and complete Lynne Leader, M.D.

## 2020-12-04 ENCOUNTER — Ambulatory Visit: Payer: Self-pay

## 2020-12-04 ENCOUNTER — Ambulatory Visit: Payer: BC Managed Care – PPO | Admitting: Family Medicine

## 2020-12-04 ENCOUNTER — Ambulatory Visit (INDEPENDENT_AMBULATORY_CARE_PROVIDER_SITE_OTHER): Payer: BC Managed Care – PPO

## 2020-12-04 ENCOUNTER — Other Ambulatory Visit: Payer: Self-pay

## 2020-12-04 ENCOUNTER — Encounter: Payer: Self-pay | Admitting: Family Medicine

## 2020-12-04 VITALS — BP 102/70 | HR 58 | Ht 62.25 in | Wt 149.2 lb

## 2020-12-04 DIAGNOSIS — M25561 Pain in right knee: Secondary | ICD-10-CM | POA: Diagnosis not present

## 2020-12-04 DIAGNOSIS — M79641 Pain in right hand: Secondary | ICD-10-CM

## 2020-12-04 DIAGNOSIS — G8929 Other chronic pain: Secondary | ICD-10-CM

## 2020-12-04 MED ORDER — NAPROXEN 375 MG PO TABS: 375.0000 mg | ORAL_TABLET | Freq: Two times a day (BID) | ORAL | 1 refills | Status: DC

## 2020-12-04 NOTE — Patient Instructions (Addendum)
Good to see you today.  Recheck in 1 month.   Please get an Xray today before you leave.  Please use Voltaren gel (Generic Diclofenac Gel) up to 4x daily for pain as needed.  This is available over-the-counter as both the name brand Voltaren gel and the generic diclofenac gel.

## 2020-12-07 NOTE — Progress Notes (Signed)
Knee swelling is present.  Otherwise no fracture or significant arthritis is present

## 2021-01-03 NOTE — Progress Notes (Signed)
   I, Wendy Poet, LAT, ATC, am serving as scribe for Dr. Lynne Leader.  Dominique [name] is a 60 y.o. female who presents to [place] at [facility] today for f/u R 5th finger pain concerning for traumatic synovitis.  She was last seen by Dr. Georgina Snell on 12/04/20 and noted no change in her symptoms despite having completed 6 hand therapy sessions and doing her HEP.  She was prescribed naproxen as she was not ready to proceed w/ an injection.  Today, pt reports that her R 5th finger is doing much better, rating her improvement at 75%.  She con't to take the naproxen and has been doing her HEP but is no longer attending hand therapy.    Diagnostic testing: R 5th finger XR- 09/19/20  Pertinent review of systems: No fevers or chills  Relevant historical information: Sleep apnea.  Essential tremor.   Exam:  BP 104/70 (BP Location: Right Arm, Patient Position: Sitting, Cuff Size: Normal)   Pulse (!) 55   Ht 5' 2.25" (1.581 m)   Wt 151 lb 9.6 oz (68.8 kg)   SpO2 96%   BMI 27.51 kg/m  General: Well Developed, well nourished, and in no acute distress.   MSK: Right fifth PIP.  Mild swelling.  Nontender normal motion.  Right knee normal motion.      Assessment and Plan: 60 y.o. female with right fifth PIP pain improved with hand PT and oral naproxen.  Patient will attempt to wean the oral naproxen in 1 month and if not better will return and likely will proceed with injection at that time.  Knee pain continue home exercise program.  Goal to wean off naproxen in 1 month.  Discussed warning signs or symptoms. Please see discharge instructions. Patient expresses understanding.   The above documentation has been reviewed and is accurate and complete Lynne Leader, M.D.   Total encounter time 20 minutes including face-to-face time with the patient and, reviewing past medical record, and charting on the date of service.   Treatment plan and options going forward.

## 2021-01-04 ENCOUNTER — Ambulatory Visit: Payer: BC Managed Care – PPO | Admitting: Family Medicine

## 2021-01-04 ENCOUNTER — Encounter: Payer: Self-pay | Admitting: Family Medicine

## 2021-01-04 ENCOUNTER — Other Ambulatory Visit: Payer: Self-pay

## 2021-01-04 VITALS — BP 104/70 | HR 55 | Ht 62.25 in | Wt 151.6 lb

## 2021-01-04 DIAGNOSIS — M79641 Pain in right hand: Secondary | ICD-10-CM

## 2021-01-04 DIAGNOSIS — M25561 Pain in right knee: Secondary | ICD-10-CM

## 2021-01-04 DIAGNOSIS — G8929 Other chronic pain: Secondary | ICD-10-CM | POA: Diagnosis not present

## 2021-01-04 NOTE — Patient Instructions (Addendum)
Good to see you today.  Follow-up: as needed.   Wean off the naproxen in about a month or sooner.  If the pain comes back then we should do an injection.

## 2021-01-11 ENCOUNTER — Encounter: Payer: Self-pay | Admitting: Family Medicine

## 2021-01-14 MED ORDER — BUPROPION HCL ER (XL) 300 MG PO TB24: 300.0000 mg | ORAL_TABLET | Freq: Every day | ORAL | 1 refills | Status: DC

## 2021-01-14 NOTE — Telephone Encounter (Signed)
See note

## 2021-04-19 ENCOUNTER — Encounter: Payer: Self-pay | Admitting: Family Medicine

## 2021-04-22 ENCOUNTER — Other Ambulatory Visit: Payer: Self-pay | Admitting: Family Medicine

## 2021-04-22 ENCOUNTER — Other Ambulatory Visit: Payer: Self-pay

## 2021-04-22 DIAGNOSIS — K219 Gastro-esophageal reflux disease without esophagitis: Secondary | ICD-10-CM

## 2021-04-22 MED ORDER — BUPROPION HCL ER (XL) 300 MG PO TB24: 300.0000 mg | ORAL_TABLET | Freq: Every day | ORAL | 1 refills | Status: DC

## 2021-04-22 MED ORDER — ESCITALOPRAM OXALATE 20 MG PO TABS: 20.0000 mg | ORAL_TABLET | Freq: Every day | ORAL | 1 refills | Status: DC

## 2021-04-22 MED ORDER — CHOLESTYRAMINE 4 G PO PACK: 4.0000 g | PACK | Freq: Every day | ORAL | 12 refills | Status: DC

## 2021-04-22 MED ORDER — LEVOTHYROXINE SODIUM 25 MCG PO TABS: 25.0000 ug | ORAL_TABLET | Freq: Every day | ORAL | 1 refills | Status: DC

## 2021-04-22 MED ORDER — GABAPENTIN 100 MG PO CAPS: ORAL_CAPSULE | ORAL | 1 refills | Status: DC

## 2021-04-22 MED ORDER — PROPRANOLOL HCL 60 MG PO TABS: 60.0000 mg | ORAL_TABLET | Freq: Two times a day (BID) | ORAL | 1 refills | Status: DC

## 2021-04-22 MED ORDER — OMEPRAZOLE 20 MG PO CPDR: 20.0000 mg | DELAYED_RELEASE_CAPSULE | Freq: Every day | ORAL | 3 refills | Status: DC

## 2021-04-23 ENCOUNTER — Other Ambulatory Visit: Payer: Self-pay

## 2021-04-23 MED ORDER — BUPROPION HCL ER (XL) 300 MG PO TB24: 300.0000 mg | ORAL_TABLET | Freq: Every day | ORAL | 1 refills | Status: DC

## 2021-04-23 NOTE — Telephone Encounter (Signed)
Spoke with patient and advised the Omeprazole can be purchased OTC as well, The Bupropion was sent over with other medications on 04/22/21. Will call pharmacy to get clarification on any issues.

## 2021-05-15 ENCOUNTER — Ambulatory Visit (INDEPENDENT_AMBULATORY_CARE_PROVIDER_SITE_OTHER): Payer: BC Managed Care – PPO | Admitting: Family Medicine

## 2021-05-15 VITALS — BP 98/62 | HR 53 | Temp 97.8°F | Ht 62.25 in | Wt 149.0 lb

## 2021-05-15 DIAGNOSIS — B028 Zoster with other complications: Secondary | ICD-10-CM | POA: Diagnosis not present

## 2021-05-15 MED ORDER — VALACYCLOVIR HCL 1 G PO TABS: 1000.0000 mg | ORAL_TABLET | Freq: Three times a day (TID) | ORAL | 3 refills | Status: AC

## 2021-05-15 NOTE — Patient Instructions (Signed)
It was very nice to see you today! ? ?I think you are having a shingles outbreak.  Please start the Valtrex.  Let me know if not improving. ? ?Take care, ?Dr [name] ? ?PLEASE NOTE: ? ?If you had any lab tests please let us know if you have not heard back within a few days. You may see your results on mychart before we have a chance to review them but we will give you a call once they are reviewed by [place]. If we ordered any referrals today, please let us know if you have not heard from their office within the next week.  ? ?Please try these tips to maintain a healthy lifestyle: ? ?Eat at least 3 REAL meals and 1-2 snacks per day.  Aim for no more than 5 hours between eating.  If you eat breakfast, please do so within one hour of getting up.  ? ?Each meal should contain half fruits/vegetables, one quarter protein, and one quarter carbs (no bigger than a computer mouse) ? ?Cut down on sweet beverages. This includes juice, soda, and sweet tea.  ? ?Drink at least 1 glass of water with each meal and aim for at least 8 glasses per day ? ?Exercise at least 150 minutes every week.   ?

## 2021-05-15 NOTE — Assessment & Plan Note (Addendum)
Rash consistent with shingles.  She does not currently appear to have any ocular involvement -no vision changes or pain with extraocular eye movements.  Even though the rash has been present for more than 72 hours given her anatomic location we decided to treat with course of Valtrex.  We discussed having her follow-up with an ophthalmologist though given her lack of ocular symptoms at this point would be reasonable to defer for now.  We discussed reasons to return to care or seek emergent care and reasons to follow up with ophthalmology. ? ?We discussed with patient this may be a recurring issue in the future.  We will give refills on the Valtrex prescription.  Advised patient to start at the first sign of any recurrence going forward.  She is agreeable to this plan. ?

## 2021-05-15 NOTE — Progress Notes (Signed)
? ?  Dominique [name] is a 61 y.o. female who presents today for an office visit. ? ?Assessment/Plan:  ?Chronic Problems Addressed Today: ?Herpes zoster with complication ?Rash consistent with shingles.  She does not currently appear to have any ocular involvement -no vision changes or pain with extraocular eye movements.  Even though the rash has been present for more than 72 hours given her anatomic location we decided to treat with course of Valtrex.  We discussed having her follow-up with an ophthalmologist though given her lack of ocular symptoms at this point would be reasonable to defer for now.  We discussed reasons to return to care or seek emergent care and reasons to follow up with ophthalmology. ? ?We discussed with patient this may be a recurring issue in the future.  We will give refills on the Valtrex prescription.  Advised patient to start at the first sign of any recurrence going forward.  She is agreeable to this plan. ? ? ?  ?Subjective:  ?HPI: ? ?Patient here with rash on left eye.  Started a few weeks ago.  No specific treatments tried.  Area is itchy.  She had something similar about a year ago after seeing her shingles vaccine.  No pain.  No vision changes.  No eye pain.  No fevers or chills.  No reported hearing loss. ?   ?  ?Objective:  ?Physical Exam: ?BP 98/62 (BP Location: Right Arm)   Pulse (!) 53   Temp 97.8 ?F (36.6 ?C) (Temporal)   Ht 5' 2.25" (1.581 m)   Wt 149 lb (67.6 kg)   SpO2 99%   BMI 27.03 kg/m?   ?Gen: No acute distress, resting comfortably ?HEENT: Erythematous vesicular appearing rash involving left upper and lower eyelid and medial corner of left eye. EOMI without pain.  Visual acuity grossly intact. ?Neuro: Grossly normal, moves all extremities ?Psych: Normal affect and thought content ? ?   ? ?Algis Greenhouse. [name], MD ?05/15/2021 11:42 AM  ?

## 2021-06-24 ENCOUNTER — Other Ambulatory Visit: Payer: Self-pay | Admitting: Family Medicine

## 2021-06-24 DIAGNOSIS — Z1231 Encounter for screening mammogram for malignant neoplasm of breast: Secondary | ICD-10-CM

## 2021-06-26 ENCOUNTER — Ambulatory Visit
Admission: RE | Admit: 2021-06-26 | Discharge: 2021-06-26 | Disposition: A | Discharge status: Home / Self Care | Payer: BC Managed Care – PPO | Source: Ambulatory Visit

## 2021-06-26 DIAGNOSIS — Z1231 Encounter for screening mammogram for malignant neoplasm of breast: Secondary | ICD-10-CM | POA: Diagnosis not present

## 2021-07-03 ENCOUNTER — Encounter: Payer: Self-pay | Admitting: Family Medicine

## 2021-07-03 NOTE — Telephone Encounter (Signed)
Ok to place referral for new sleep study?  ?

## 2021-07-04 ENCOUNTER — Other Ambulatory Visit: Payer: Self-pay

## 2021-07-04 DIAGNOSIS — G4733 Obstructive sleep apnea (adult) (pediatric): Secondary | ICD-10-CM

## 2021-07-04 NOTE — Telephone Encounter (Signed)
Ok to place referral to sleep medicine. ? ?Algis Greenhouse. [name], MD ?07/04/2021 12:32 PM  ? ?

## 2021-07-04 NOTE — Telephone Encounter (Signed)
Called pt and lvm that referral was placed and should be hearing from someone to schedule an appt in next 7-10 business days, but to call our office with any further questions or concerns.  ?

## 2021-07-05 ENCOUNTER — Other Ambulatory Visit: Payer: Self-pay | Admitting: Radiology

## 2021-07-05 DIAGNOSIS — Z1231 Encounter for screening mammogram for malignant neoplasm of breast: Secondary | ICD-10-CM

## 2021-07-25 ENCOUNTER — Ambulatory Visit: Payer: BC Managed Care – PPO | Admitting: Neurology

## 2021-07-25 ENCOUNTER — Encounter: Payer: Self-pay | Admitting: Neurology

## 2021-07-25 VITALS — BP 111/73 | HR 54 | Ht 63.0 in | Wt 147.6 lb

## 2021-07-25 DIAGNOSIS — Z9989 Dependence on other enabling machines and devices: Secondary | ICD-10-CM

## 2021-07-25 DIAGNOSIS — R519 Headache, unspecified: Secondary | ICD-10-CM | POA: Diagnosis not present

## 2021-07-25 DIAGNOSIS — G4733 Obstructive sleep apnea (adult) (pediatric): Secondary | ICD-10-CM

## 2021-07-25 DIAGNOSIS — E663 Overweight: Secondary | ICD-10-CM | POA: Diagnosis not present

## 2021-07-25 DIAGNOSIS — Z820 Family history of epilepsy and other diseases of the nervous system: Secondary | ICD-10-CM | POA: Diagnosis not present

## 2021-07-25 NOTE — Patient Instructions (Addendum)
It was nice to meet you today!   Here is what we discussed today and what we came up with as our plan for you:    Based on your symptoms and your exam I believe you are still at risk for obstructive sleep apnea and would benefit from re-evaluation as it has been several years and you should be eligible for a new machine. Therefore, I think we should proceed with a sleep study to determine how severe your sleep apnea is.  As discussed, we will proceed with a home sleep test.   If you have more than mild OSA, I want you to consider ongoing treatment with CPAP. Please remember, the risks and ramifications of moderate to severe obstructive sleep apnea or OSA are: Cardiovascular disease, including congestive heart failure, stroke, difficult to control hypertension, arrhythmias, and even type 2 diabetes has been linked to untreated OSA. Sleep apnea causes disruption of sleep and sleep deprivation in most cases, which, in turn, can cause recurrent headaches, problems with memory, mood, concentration, focus, and vigilance. Most people with untreated sleep apnea report excessive daytime sleepiness, which can affect their ability to drive. Please do not drive if you feel sleepy.   I will likely see you back after your sleep study to go over the test results and where to go from there. We will call you after your sleep study to advise about the results (most likely, you will hear from my nurse) and to set up an appointment at the time, as necessary.    Our sleep lab administrative assistant will call you to schedule your sleep study. If you don't hear back from her by about 2 weeks from now, please feel free to call her at [phone]. You can leave a message with your phone number and concerns, if you get the voicemail box. She will call back as soon as possible.

## 2021-07-25 NOTE — Progress Notes (Signed)
Subjective:    Patient ID: Dominique [name] is a 60 y.o. female.  HPI    Dominique Age, Dominique [name], Dominique [name] [facility] [address] [address] [place], [place] [address]  Dear Dominique [name],   I saw your patient, Dominique [name], upon your kind request in my sleep clinic today for initial consultation of her sleep disorder, in particular, evaluation of her prior diagnosis of obstructive sleep apnea.  The patient is unaccompanied today.  As you know, Dr. Galvis is a 61 year old right-handed woman - retired developmental pediatrician - with an underlying medical history of anemia, asthma, anxiety, depression, reflux disease, essential tremor, hyperlipidemia, restless leg syndrome, thyroid disease, and overweight state, who was diagnosed with obstructive sleep apnea and placed on PAP therapy several years ago.  Prior sleep study results are not available for my review today, testing was about 8 years ago.  She has an older machine, at least 63 or 61 years old.  Machine has shown an error message that the motor life has been exceeded.  I reviewed her office note from 05/15/2021 as well as MyChart message encounter from 07/04/2021.  Her Epworth sleepiness score is 8 out of 24, fatigue severity score is 34 out of 63.  I was able to PAP compliance data from the past 90 days, from 04/26/2021 through 07/24/2021, during which time she used her machine 89 days with percent use days greater than 4 hours at 96% currently. Compliance with an average usage of 8 hours and 18 minutes, residual AHI borderline at 4.4/h, leak in the low side with the 95th percentile at 1.9 L/min, pressure of 8 cm with EPR of 3.  In the past 2 weeks she has had some decline in music.  She reports that she had acknowledgment of cough and congestion and was not always able to use her machine, other than that she has benefited from treatment in these past years, in particular with regards to improvement of her sleep consolidation, sleep  quality and daytime somnolence as well as morning headaches.  She is married and lives with her husband.  She has 3 grown children.  She has been having trouble getting new supplies, she uses a fullface mask. They have 1 dog in the household.  Bedtime is generally between 11 and midnight, rise time between 8 and 9.  She drinks caffeine in the form of soda, 1 serving per day and 1 serving of milk tea/chai.  She does not have night to night nocturia.  Her brother has sleep apnea.  She has had a fairly stable weight.  Her Past Medical History Is Significant For: Past Medical History:  Diagnosis Date   Anemia    on meds   Anxiety    on meds   Asthma    uses inhaler as needed   Blood transfusion without reported diagnosis 1992   post d/c   Cervix prolapsed into vagina    Uro-Gyne Exam 2011, 2014   Depression    on meds   GERD (gastroesophageal reflux disease)    on meds   Hearing loss    High cholesterol    diet controlled-no meds at this time (04/17/2020)   Migraines    hx of   Neuromuscular disorder (HCC)    tremors   Restless leg syndrome    taking iron to support   Sleep apnea    uses CPAP   Thyroid disease    on meds   Tremor  Her Past Surgical History Is Significant For: Past Surgical History:  Procedure Laterality Date   CHOLECYSTECTOMY N/A 08/14/2019   Procedure: LAPAROSCOPIC CHOLECYSTECTOMY;  Surgeon: Kinsinger, Arta Bruce, Dominique [name];  Location: [facility];  Service: General;  Laterality: N/A;   COLONOSCOPY  2015   with EGD at [facility]  02/24/1990   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     MOUTH SURGERY     3 teeth removed    ROOT CANAL     2 times    [place]      Her Family History Is Significant For: Family History  Problem Relation [name] of Onset   Hypertension Mother    High Cholesterol Mother    Cataracts Mother    Retinal detachment Mother    Colon cancer Mother 60   Colon polyps Mother  59   Hypertension Brother    High Cholesterol Brother    Sleep apnea Brother    Heart disease Maternal Uncle    Diabetes Paternal Aunt    Breast cancer Paternal Aunt    Colon polyps Paternal Grandmother    Diabetes Paternal Grandfather    Arthritis Paternal Grandfather    Depression Daughter    Menstrual problems Daughter    Anxiety disorder Daughter    ADD / ADHD Son    Depression Son    Anxiety disorder Son    Breast cancer Cousin    Esophageal cancer Neg Hx    Stomach cancer Neg Hx    Rectal cancer Neg Hx     Her Social History Is Significant For: Social History   Socioeconomic History   Marital status: Married    Spouse name: Not on file   Number of children: 3   Years of education: Not on file   Highest education level: Not on file  Occupational History   Occupation: retired    Comment: pediatrician  Tobacco Use   Smoking status: Never   Smokeless tobacco: Never  Vaping Use   Vaping Use: Never used  Substance and Sexual Activity   Alcohol use: Not Currently    Alcohol/week: 2.0 standard drinks    Types: 2 Shots of liquor per week   Drug use: No   Sexual activity: Not Currently  Other Topics Concern   Not on file  Social History Narrative   As of 11/11/16   Diet: Minimal red meat, lactose intolerance       Caffeine: Yes      Married, if yes what year: Yes, 2006      Do you live in a house, apartment, assisted living, condo, trailer, ect: House, 2 stories, 2 persons      Pets: 1 dog      Current/Past profession: Pediatrician       Exercise: A little, walking dog and elliptical          Living Will: No   DNR: No   POA/HPOA: No      Functional Status:   Do you have difficulty bathing or dressing yourself? No   Do you have difficulty preparing food or eating? No   Do you have difficulty managing your medications? No    Do you have difficulty managing your finances? No    Do you have difficulty affording your medications? No       Right handed    Lives in two story home with husband and dog  Social Determinants of Health   Financial Resource Strain: Not on file  Food Insecurity: Not on file  Transportation Needs: Not on file  Physical Activity: Not on file  Stress: Not on file  Social Connections: Not on file    Her Allergies Are:  Allergies  Allergen Reactions   Bactrim [Sulfamethoxazole-Trimethoprim] Rash   Elemental Sulfur Rash   Sulfa Antibiotics Rash  :   Her Current Medications Are:  Outpatient Encounter Medications as of 07/25/2021  Medication Sig   albuterol (VENTOLIN HFA) 108 (90 Base) MCG/ACT inhaler Inhale 1 puff into the lungs every 6 (six) hours as needed for wheezing or shortness of breath.   buPROPion (WELLBUTRIN XL) 300 MG 24 hr tablet Take 1 tablet (300 mg total) by mouth daily.   cholestyramine (QUESTRAN) 4 g packet Take 1 packet (4 g total) by mouth daily.   escitalopram (LEXAPRO) 20 MG tablet Take 1 tablet (20 mg total) by mouth daily.   ferrous sulfate 325 (65 FE) MG tablet Take 325 mg by mouth daily.   gabapentin (NEURONTIN) 100 MG capsule TAKE 1 CAPSULE NIGHTLY   levothyroxine (SYNTHROID) 25 MCG tablet Take 1 tablet (25 mcg total) by mouth daily before breakfast.   omeprazole (PRILOSEC) 20 MG capsule Take 1 capsule (20 mg total) by mouth daily.   propranolol (INDERAL) 60 MG tablet Take 1 tablet (60 mg total) by mouth 2 (two) times daily.   naproxen (NAPROSYN) 375 MG tablet Take 1 tablet (375 mg total) by mouth 2 (two) times daily with a meal.   No facility-administered encounter medications on file as of 07/25/2021.  :   Review of Systems:  Out of a complete 14 point review of systems, all are reviewed and negative with the exception of these symptoms as listed below:  Review of Systems  Neurological:        Pt is here for sleep consult  pt had sleep study 7 years ago and that's how long she has had a CPAP machine . Pt has CPAP machine with her today for the appointment. Pt states she is here  to establish care   ESS:8  FSS 34    Objective:  Neurological Exam  Physical Exam Physical Examination:   Vitals:   07/25/21 1401  BP: 111/73  Pulse: (!) 54    General Examination: The patient is a very pleasant 61 y.o. female in no acute distress. She appears well-developed and well-nourished and well groomed.   HEENT: Normocephalic, atraumatic, pupils are equal, round and reactive to light, extraocular tracking is good without limitation to gaze excursion or nystagmus noted. Hearing is grossly intact. Face is symmetric with normal facial animation. Speech is clear with no dysarthria noted. There is no hypophonia. There is a mild intermittent head tremor, slight intermittent voice tremor noted as well.  Neck is supple with full range of passive and active motion. There are no carotid bruits on auscultation. Oropharynx exam reveals: No significant mouth dryness, good dental hygiene, moderate airway crowding with a small airway noted, Mallampati class II, tonsillar size of about 1-2+.  Neck circumference of 14-1/2 inches.  She has a mild to moderate overbite.  Tongue protrudes centrally and palate elevates symmetrically.   Chest: Clear to auscultation without wheezing, rhonchi or crackles noted.  Heart: S1+S2+0, regular and normal without murmurs, rubs or gallops noted.   Abdomen: Soft, non-tender and non-distended.  Extremities: There is trace edema in the left more than right ankle.     Skin: Warm  and dry without trophic changes noted.   Musculoskeletal: exam reveals no obvious joint deformities.   Neurologically:  Mental status: The patient is awake, alert and oriented in all 4 spheres. Her immediate and remote memory, attention, language skills and fund of knowledge are appropriate. There is no evidence of aphasia, agnosia, apraxia or anomia. Speech is clear with normal prosody and enunciation. Thought process is linear. Mood is normal and affect is normal.  Cranial nerves II -  XII are as described above under HEENT exam.  Motor exam: Normal bulk, strength and tone is noted. There is no resting tremor.  She has a mild postural and action tremor in both hands. Fine motor skills and coordination: grossly intact.  Cerebellar testing: No dysmetria or intention tremor. There is no truncal or gait ataxia.  Sensory exam: intact to light touch in the upper and lower extremities.  Gait, station and balance: She stands easily. No veering to one side is noted. No leaning to one side is noted. Posture is [name]-appropriate and stance is narrow based. Gait shows normal stride length and normal pace. No problems turning are noted.  Assessment and plan:  In summary, Simrit Gohlke is a very pleasant 61 y.o.-year old female with an underlying medical history of anemia, asthma, anxiety, depression, reflux disease, essential tremor, hyperlipidemia, restless leg syndrome, thyroid disease, and overweight state, who presents for evaluation of her obstructive sleep apnea.  She was diagnosed some 7 or 8 years ago and her machine is older, of note, she has a Metallurgist.  She should be eligible for new equipment.  She has been using a fullface mask.  She has had benefit from treatment of her sleep apnea particularly with regards to morning headaches and sleep quality.  She is advised to proceed with reevaluation with a home sleep test at this time.  I explained this test procedure to the patient and she is agreeable to proceeding.  We will issue a new prescription for a new CPAP/AutoPap machine after testing.  She is interested in establishing with a local DME company. We will plan to follow-up after testing.  We did talk about her sleep apnea prognosis and treatment options including alternative treatment options such as surgical options including inspire or dental treatment with an oral appliance.  She is not keen on exploring other options as she has done really well on PAP therapy and we  mutually agreed to pursue ongoing PAP therapy after testing. We will also plan a follow-up accordingly.  I answered all her questions today and she was in agreement.  Thank you very much for allowing me to participate in the care of this nice patient. If I can be of any further assistance to you please do not hesitate to call me at [phone].  Sincerely,   Dominique Age, Dominique [name], Dominique [name]

## 2021-07-27 ENCOUNTER — Encounter: Payer: Self-pay | Admitting: Neurology

## 2021-08-06 ENCOUNTER — Telehealth: Payer: Self-pay | Admitting: Neurology

## 2021-08-06 NOTE — Telephone Encounter (Signed)
08/05/21 [employer] Josem Kaufmann: [id number] (exp. 08/05/21 to 10/03/21) EE Left VM 08/05/21 [place]

## 2021-08-07 NOTE — Telephone Encounter (Signed)
HST- [employer] Josem Kaufmann: [id number] (exp. 08/05/21 to 10/03/21)   Patient returned my call she is scheduled at [facility] for 08/20/21 at 2 pm.

## 2021-08-22 NOTE — Telephone Encounter (Signed)
Error didn't leave vmail sent the patient a mychart

## 2021-08-22 NOTE — Telephone Encounter (Signed)
x2 LVM for pt to schedule

## 2021-09-07 ENCOUNTER — Encounter: Payer: Self-pay | Admitting: Family Medicine

## 2021-09-13 ENCOUNTER — Ambulatory Visit (INDEPENDENT_AMBULATORY_CARE_PROVIDER_SITE_OTHER): Payer: BC Managed Care – PPO | Admitting: Family Medicine

## 2021-09-13 VITALS — BP 132/82 | HR 47 | Temp 98.0°F | Ht 63.0 in | Wt 145.2 lb

## 2021-09-13 DIAGNOSIS — G4733 Obstructive sleep apnea (adult) (pediatric): Secondary | ICD-10-CM

## 2021-09-13 DIAGNOSIS — Z9989 Dependence on other enabling machines and devices: Secondary | ICD-10-CM

## 2021-09-13 DIAGNOSIS — G250 Essential tremor: Secondary | ICD-10-CM

## 2021-09-13 MED ORDER — GABAPENTIN 300 MG PO CAPS: 300.0000 mg | ORAL_CAPSULE | Freq: Every day | ORAL | 3 refills | Status: DC

## 2021-09-13 NOTE — Progress Notes (Signed)
   Dominique [name] is a 61 y.o. female who presents today for an office visit.  Assessment/Plan:  New/Acute Problems: Left Heel [name] Consistent with plantar fasciitis.  Reassuring exam today.  We discussed conservative management including home exercises, ice, arch support, and over-the-counter NSAIDs.  She will let me know if not improving in the next few weeks and we can refer to sports medicine.  Chronic Problems Addressed Today: Benign essential tremor Not controlled.  She is on propranolol 60 mg twice daily.  Will increase her gabapentin to 300 mg daily which should hopefully help some with her essential tremor as well.  She can follow-up with neurology if this continues to be an issue.  OSA on CPAP Follows with neurology.     Subjective:  HPI:  Patient here with heel [name]. Started about a month ago after stepping something. She thought she had a foreign body that she removed. [name] located in bottom of her left foot. [name] worse with first step in the morning intially but not seems to be more persistent lately.  She has not tried any specific treatments.  She did have plantar fasciitis several years ago thinks the [name] may be similar to this.  See A/P for status of chronic conditions.       Objective:  Physical Exam: BP 132/82   Pulse (!) 47   Temp 98 F (36.7 C) (Temporal)   Ht '5\' 3"'$  (1.6 m)   Wt 145 lb 3.2 oz (65.9 kg)   SpO2 99%   BMI 25.72 kg/m   Gen: No acute distress, resting comfortably MSK: -Right Foot: No deformities.  Tenderness to palpation near plantar fascia insertion of calcaneus.  No ecchymoses.  Full range of motion throughout. Neuro: Grossly normal, moves all extremities Psych: Normal affect and thought content      Dominique [name] M. [name], MD 09/13/2021 8:31 AM

## 2021-09-13 NOTE — Assessment & Plan Note (Signed)
Follows with neurology 

## 2021-09-13 NOTE — Patient Instructions (Signed)
It was very nice to see you today!  I think you have plantar fasciitis.  Please try working on the exercises.  You can use ibuprofen or naproxen as needed for the next couple of weeks.  Please also try using a frozen water bottle or frozen tennis ball to help with your pain.  You can also try using Dr. Felicie Morn inserts for Planter fasciitis.  Please let us know if not improving in the next few weeks and we can refer you to see sports medicine.  We will increase your gabapentin to 300 mg nightly.  Take care, Dr [name]  PLEASE NOTE:  If you had any lab tests please let us know if you have not heard back within a few days. You may see your results on mychart before we have a chance to review them but we will give you a call once they are reviewed by [place]. If we ordered any referrals today, please let us know if you have not heard from their office within the next week.   Please try these tips to maintain a healthy lifestyle:  Eat at least 3 REAL meals and 1-2 snacks per day.  Aim for no more than 5 hours between eating.  If you eat breakfast, please do so within one hour of getting up.   Each meal should contain half fruits/vegetables, one quarter protein, and one quarter carbs (no bigger than a computer mouse)  Cut down on sweet beverages. This includes juice, soda, and sweet tea.   Drink at least 1 glass of water with each meal and aim for at least 8 glasses per day  Exercise at least 150 minutes every week.

## 2021-09-13 NOTE — Assessment & Plan Note (Signed)
Not controlled.  She is on propranolol 60 mg twice daily.  Will increase her gabapentin to 300 mg daily which should hopefully help some with her essential tremor as well.  She can follow-up with neurology if this continues to be an issue.

## 2021-09-17 ENCOUNTER — Ambulatory Visit: Payer: BC Managed Care – PPO | Admitting: Neurology

## 2021-09-17 DIAGNOSIS — E663 Overweight: Secondary | ICD-10-CM

## 2021-09-17 DIAGNOSIS — G4733 Obstructive sleep apnea (adult) (pediatric): Secondary | ICD-10-CM | POA: Diagnosis not present

## 2021-09-17 DIAGNOSIS — Z820 Family history of epilepsy and other diseases of the nervous system: Secondary | ICD-10-CM

## 2021-09-17 DIAGNOSIS — R519 Headache, unspecified: Secondary | ICD-10-CM

## 2021-09-19 NOTE — Progress Notes (Signed)
See procedure note.

## 2021-09-23 NOTE — Procedures (Signed)
   [facility]  HOME SLEEP TEST (Watch PAT) REPORT  STUDY DATE: 09/17/2021  DOB: 09-19-1960  MRN: [id number]  ORDERING CLINICIAN: Star Age, MD, PhD   REFERRING CLINICIAN: Vivi Barrack, MD   CLINICAL INFORMATION/HISTORY: 61 year old woman with a history of anemia, asthma, anxiety, depression, reflux disease, essential tremor, hyperlipidemia, restless leg syndrome, thyroid disease, and overweight state, who was diagnosed with obstructive sleep apnea and placed on PAP therapy several years ago.  Prior sleep testing was about 8 years ago.  She has an older machine, at least 10 or 61 years old. The machine has shown an error message that the motor life has been exceeded.  She presents for reevaluation.  Epworth sleepiness score: 8/24.  BMI: 26.2 kg/m  FINDINGS:   Sleep Summary:   Total Recording Time (hours, min): 9 hours, 16 min  Total Sleep Time (hours, min):  7 hours, 14 min  Percent REM (%):    15.4%   Respiratory Indices:   Calculated pAHI (per hour):  7.4/hour         REM pAHI:    10/hour       NREM pAHI: 6.8/hour  Central pAHI: 0.3/hour  Oxygen Saturation Statistics:    Oxygen Saturation (%) Mean: 94%   Minimum oxygen saturation (%):                 88%   O2 Saturation Range (%): 88-98%    O2 Saturation (minutes) <=88%: 0.1 min  Pulse Rate Statistics:   Pulse Mean (bpm):    56/min    Pulse Range (48-82/min)   IMPRESSION: OSA (obstructive sleep apnea), mild   RECOMMENDATION:  This home sleep test demonstrates overall mild obstructive sleep apnea with a total AHI of 7.4/hour and O2 nadir of 88%. Snoring was detected, intermittent, in the mild to moderate range.  The patient has been compliant with her CPAP.  She qualifies for new equipment, I will write for a new AutoPap machine, we will keep her pressure range close to her current set pressure of 8 cm. Alternative treatments may include weight loss (where appropriate) along with  avoidance of the supine sleep position (if possible), or an oral appliance in appropriate candidates.   Please note that untreated obstructive sleep apnea may carry additional perioperative morbidity. Patients with significant obstructive sleep apnea should receive perioperative PAP therapy and the surgeons and particularly the anesthesiologist should be informed of the diagnosis and the severity of the sleep disordered breathing. The patient should be cautioned not to drive, work at heights, or operate dangerous or heavy equipment when tired or sleepy. Review and reiteration of good sleep hygiene measures should be pursued with any patient. Other causes of the patient's symptoms, including circadian rhythm disturbances, an underlying mood disorder, medication effect and/or an underlying medical problem cannot be ruled out based on this test. Clinical correlation is recommended.  The patient and her referring provider will be notified of the test results. The patient will be seen in follow up in sleep clinic at [facility].  I certify that I have reviewed the raw data recording prior to the issuance of this report in accordance with the standards of the American Academy of Sleep Medicine (AASM).  INTERPRETING PHYSICIAN:   Star Age, MD, PhD  Board Certified in Neurology and Sleep Medicine  [facility] [address] [place], [place] [address] [phone]

## 2021-09-23 NOTE — Addendum Note (Signed)
Addended by: Star Age on: 09/23/2021 05:57 PM   Modules accepted: Orders

## 2021-09-24 ENCOUNTER — Telehealth: Payer: Self-pay

## 2021-09-24 NOTE — Telephone Encounter (Signed)
-----   Message from Star Age, MD sent at 09/23/2021  5:57 PM EDT ----- Patient was seen on 07/25/2021 for reevaluation of her OSA.  She has an older CPAP machine.  She had a home sleep test on 09/17/2021 which showed overall mild sleep apnea.  I would like to write for a new AutoPap machine.  If she is agreeable, we will send her order to her previous DME or her current DME of choice.  Please advise patient to continue to be compliant with treatment and follow-up within 1 to 3 months of set up.

## 2021-09-24 NOTE — Telephone Encounter (Signed)
I called pt. No answer, left a message asking pt to call me back.   

## 2021-09-25 NOTE — Telephone Encounter (Signed)
Second attempt to reach pt. No answer, left message asking for call back. Left office number in message.

## 2021-09-30 NOTE — Telephone Encounter (Signed)
Order, sleep study, demographics, insurance info, all faxed to [facility]. Received a receipt of confirmation.

## 2021-10-09 DIAGNOSIS — G4733 Obstructive sleep apnea (adult) (pediatric): Secondary | ICD-10-CM | POA: Diagnosis not present

## 2021-10-19 ENCOUNTER — Other Ambulatory Visit: Payer: Self-pay | Admitting: Family Medicine

## 2021-10-28 ENCOUNTER — Other Ambulatory Visit: Payer: Self-pay | Admitting: Family Medicine

## 2021-11-09 DIAGNOSIS — G4733 Obstructive sleep apnea (adult) (pediatric): Secondary | ICD-10-CM | POA: Diagnosis not present

## 2021-11-18 ENCOUNTER — Encounter: Payer: Self-pay | Admitting: *Deleted

## 2021-12-04 ENCOUNTER — Encounter: Payer: Self-pay | Admitting: Neurology

## 2021-12-04 ENCOUNTER — Ambulatory Visit: Payer: BC Managed Care – PPO | Admitting: Neurology

## 2021-12-04 VITALS — BP 110/74 | HR 57 | Ht 63.0 in | Wt 150.6 lb

## 2021-12-04 DIAGNOSIS — G4733 Obstructive sleep apnea (adult) (pediatric): Secondary | ICD-10-CM | POA: Diagnosis not present

## 2021-12-04 NOTE — Patient Instructions (Signed)
It was nice to see you again today. I am glad to hear, things are going well with your autoPAP therapy. You have adjusted well to treatment with your new machine, and you are compliant with it. You have also fulfilled the insurance-mandated compliance percentage, which is reassuring, so you can get ongoing supplies through your insurance. Please talk to your DME provider about getting replacement supplies on a regular basis. Please be sure to change your filter every month, your mask about every 3 months, hose about every 6 months, humidifier chamber about yearly. Some restrictions are imposed by your insurance carrier with regard to how frequently you can get certain supplies.  Your DME company can provide further details if necessary.   Please continue using your autoPAP regularly. While your insurance requires that you use PAP at least 4 hours each night on 70% of the nights, I recommend, that you not skip any nights and use it throughout the night if you can. Getting used to PAP and staying with the treatment long term does take time and patience and discipline. Untreated obstructive sleep apnea when it is moderate to severe can have an adverse impact on cardiovascular health and raise her risk for heart disease, arrhythmias, hypertension, congestive heart failure, stroke and diabetes. Untreated obstructive sleep apnea causes sleep disruption, nonrestorative sleep, and sleep deprivation. This can have an impact on your day to day functioning and cause daytime sleepiness and impairment of cognitive function, memory loss, mood disturbance, and problems focussing. Using PAP regularly can improve these symptoms.  We can see you in 1 year, you can see one of our nurse practitioners as you are stable.   

## 2021-12-04 NOTE — Progress Notes (Signed)
Subjective:    Patient ID: Dominique [name] is a 61 y.o. female.  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  The patient is unaccompanied today.  I first met her on 07/25/2021 at the request of her primary care physician, at which time she reported a prior diagnosis of sleep apnea, she had an order CPAP machine, she was compliant with it.  She was advised to proceed with a sleep study.  She had a home sleep test on 09/17/2021 which indicated overall mild obstructive sleep apnea with an AHI of 7.4/h, O2 nadir 88% with intermittent mild to moderate snoring detected.  She was advised to continue with positive airway pressure treatment at home and I wrote for a new AutoPap machine.  Her set up date was 10/09/2021.  She has a ResMed air sense 11 AutoSet machine.  Today, 12/04/2021: I reviewed her AutoPap compliance data from 11/03/2021 through 12/02/2021, which is a total of 30 days, during which time she used her machine every night with percent used days greater than 4 hours at 100%, indicating superb compliance with an average usage of 8 hours and 16 minutes, residual AHI at goal at 2.0/h, 95th percentile of pressure at 9.7 cm with a range of 6 to 10 cm with EPR, low leak with the 95th percentile at 0.5 L/min.  She reports doing well with her new machine has adjusted well to AutoPap therapy and is compliant with treatment and continues to benefit from treatment. She is motivated to continue at the same settings, she uses a fullface mask.   The patient's allergies, current medications, family history, past medical history, past  social history, past surgical history and problem list were reviewed and updated as appropriate.   Previously:   07/25/21: (She) was diagnosed with obstructive sleep apnea and placed on PAP therapy several years ago.  Prior sleep study results are not available for my review today, testing was about 8 years ago.  She has an older machine, at least 78 or 61 years old.  Machine has shown an error message that the motor life has been exceeded.  I reviewed her office note from 05/15/2021 as well as MyChart message encounter from 07/04/2021.  Her Epworth sleepiness score is 8 out of 24, fatigue severity score is 34 out of 63.  I was able to PAP compliance data from the past 90 days, from 04/26/2021 through 07/24/2021, during which time she used her machine 89 days with percent use days greater than 4 hours at 96% currently with an average usage of 8 hours and 18 minutes, residual AHI borderline at 4.4/h, leak in the low side with the 95th percentile at 1.9 L/min, pressure of 8 cm with EPR of 3.  In the past 2 weeks she has had some decline in usage. She reports that she had acknowledgment of cough and congestion and was not always able to use her machine, other than that she has benefited from treatment in these past years, in particular with regards to improvement of her sleep consolidation, sleep quality and daytime somnolence as well as morning headaches.  She is married and lives with her husband.  She has 3  grown children.  She has been having trouble getting new supplies, she uses a fullface mask. They have 1 dog in the household.  Bedtime is generally between 11 and midnight, rise time between 8 and 9.  She drinks caffeine in the form of soda, 1 serving per day and 1 serving of milk tea/chai.  She does not have night to night nocturia.  Her brother has sleep apnea.  She has had a fairly stable weight.    Her Past Medical History Is Significant For: Past Medical History:  Diagnosis Date   Anemia    on meds    Anxiety    on meds   Asthma    uses inhaler as needed   Blood transfusion without reported diagnosis 1992   post d/c   Cervix prolapsed into vagina    Uro-Gyne Exam 2011, 2014   Depression    on meds   GERD (gastroesophageal reflux disease)    on meds   Hearing loss    High cholesterol    diet controlled-no meds at this time (04/17/2020)   Migraines    hx of   Neuromuscular disorder (HCC)    tremors   Restless leg syndrome    taking iron to support   Sleep apnea    uses CPAP   Thyroid disease    on meds   Tremor     Her Past Surgical History Is Significant For: Past Surgical History:  Procedure Laterality Date   CHOLECYSTECTOMY N/A 08/14/2019   Procedure: LAPAROSCOPIC CHOLECYSTECTOMY;  Surgeon: [name], Dominique Bruce, MD;  Location: [facility];  Service: General;  Laterality: N/A;   COLONOSCOPY  2015   with EGD at [place]  02/24/1990   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     MOUTH SURGERY     3 teeth removed    ROOT CANAL     2 times    [place]      Her Family History Is Significant For: Family History  Problem Relation Age of Onset   Hypertension Mother    High Cholesterol Mother    Cataracts Mother    Retinal detachment Mother    Colon cancer Mother 71   Colon polyps Mother 18   Hypertension Brother    High Cholesterol Brother    Sleep apnea Brother    Heart disease Maternal Uncle    Diabetes Paternal Aunt    Breast cancer Paternal Aunt    Colon polyps Paternal Grandmother    Diabetes Paternal Grandfather    Arthritis Paternal Grandfather    Depression Daughter    Menstrual problems Daughter    Anxiety disorder Daughter    ADD / ADHD Son    Depression Son    Anxiety disorder Son    Breast cancer Cousin    Esophageal cancer Neg Hx    Stomach cancer Neg Hx    Rectal cancer Neg Hx     Her Social History Is Significant For: Social History   Socioeconomic History   Marital  status: Married    Spouse name: Not on file   Number of children: 3   Years of education: Not on file   Highest education level: Not on file  Occupational History   Occupation: retired    Comment: pediatrician  Tobacco Use   Smoking status: Never   Smokeless tobacco: Never  Vaping Use   Vaping Use: Never  used  Substance and Sexual Activity   Alcohol use: Not Currently    Alcohol/week: 2.0 standard drinks of alcohol    Types: 2 Shots of liquor per week   Drug use: No   Sexual activity: Not Currently  Other Topics Concern   Not on file  Social History Narrative   As of 11/11/16   Diet: Minimal red meat, lactose intolerance       Caffeine: Yes      Married, if yes what year: Yes, 2006      Do you live in a house, apartment, assisted living, condo, trailer, ect: House, 2 stories, 2 persons      Pets: 1 dog      Current/Past profession: Pediatrician       Exercise: A little, walking dog and elliptical          Living Will: No   DNR: No   POA/HPOA: No      Functional Status:   Do you have difficulty bathing or dressing yourself? No   Do you have difficulty preparing food or eating? No   Do you have difficulty managing your medications? No    Do you have difficulty managing your finances? No    Do you have difficulty affording your medications? No       Right handed   Lives in two story home with husband and dog   Social Determinants of Health   Financial Resource Strain: Not on file  Food Insecurity: Not on file  Transportation Needs: Not on file  Physical Activity: Not on file  Stress: Not on file  Social Connections: Not on file    Her Allergies Are:  Allergies  Allergen Reactions   Bactrim [Sulfamethoxazole-Trimethoprim] Rash   Elemental Sulfur Rash   Sulfa Antibiotics Rash  :   Her Current Medications Are:  Outpatient Encounter Medications as of 12/04/2021  Medication Sig   albuterol (VENTOLIN HFA) 108 (90 Base) MCG/ACT inhaler Inhale 1 puff into  the lungs every 6 (six) hours as needed for wheezing or shortness of breath.   buPROPion (WELLBUTRIN XL) 300 MG 24 hr tablet TAKE 1 TABLET BY MOUTH EVERY DAY   cholestyramine (QUESTRAN) 4 g packet Take 1 packet (4 g total) by mouth daily.   escitalopram (LEXAPRO) 20 MG tablet TAKE 1 TABLET BY MOUTH EVERY DAY   ferrous sulfate 325 (65 FE) MG tablet Take 325 mg by mouth daily.   gabapentin (NEURONTIN) 300 MG capsule Take 1 capsule (300 mg total) by mouth at bedtime. TAKE 1 CAPSULE NIGHTLY   omeprazole (PRILOSEC) 20 MG capsule Take 1 capsule (20 mg total) by mouth daily.   propranolol (INDERAL) 60 MG tablet TAKE 1 TABLET BY MOUTH 2 TIMES DAILY.   SYNTHROID 25 MCG tablet TAKE 1 TABLET BY MOUTH DAILY BEFORE BREAKFAST.   VITAMIN D PO Take by mouth.   No facility-administered encounter medications on file as of 12/04/2021.  :  Review of Systems:  Out of a complete 14 point review of systems, all are reviewed and negative with the exception of these symptoms as listed below:   Review of Systems  Neurological:        Pt here for CPAP f/u Pt has no questions or concerns for today's visit     ESS:8     Objective:  Neurological Exam  Physical Exam Physical Examination:   Vitals:   12/04/21 1104  BP: 110/74  Pulse: (!) 57    General Examination: The patient is  a very pleasant 61 y.o. female in no acute distress. She appears well-developed and well-nourished and well groomed.   HEENT: Normocephalic, atraumatic, pupils are equal, round and reactive to light, extraocular tracking is good without limitation to gaze excursion or obvious nystagmus noted.  Corrective eyeglasses in place.  Hearing is grossly intact. Face is symmetric with normal facial animation. Speech is clear with no dysarthria noted. There is no hypophonia. There is a mild intermittent head tremor, slight intermittent voice tremor noted as well.  Neck with full range of motion. There are no carotid bruits on auscultation.  Oropharynx exam reveals: No significant mouth dryness, good dental hygiene, moderate airway crowding.  Tongue protrudes centrally and palate elevates symmetrically.    Chest: Clear to auscultation without wheezing, rhonchi or crackles noted.   Heart: S1+S2+0, regular and normal without murmurs, rubs or gallops noted.    Abdomen: Soft, non-tender and non-distended.   Extremities: There is trace edema in the left more than right ankle.      Skin: Warm and dry without trophic changes noted.    Musculoskeletal: exam reveals no obvious joint deformities.    Neurologically:  Mental status: The patient is awake, alert and oriented in all 4 spheres. Her immediate and remote memory, attention, language skills and fund of knowledge are appropriate. There is no evidence of aphasia, agnosia, apraxia or anomia. Speech is clear with normal prosody and enunciation. Thought process is linear. Mood is normal and affect is normal.  Cranial nerves II - XII are as described above under HEENT exam.  Motor exam: Normal bulk, strength and tone is noted. There is no obvious resting mild action tremor in her hands.   Fine motor skills and coordination: grossly intact.  Cerebellar testing: No dysmetria or intention tremor. There is no truncal or gait ataxia.  Sensory exam: intact to light touch in the upper and lower extremities.  Gait, station and balance: She stands easily. No veering to one side is noted. No leaning to one side is noted. Posture is age-appropriate and stance is narrow based. Gait shows normal stride length and normal pace. No problems turning are noted.  Assessment and plan:  In summary, Tamy Accardo is a very pleasant 61 year old female with an underlying medical history of anemia, asthma, anxiety, depression, reflux disease, essential tremor, hyperlipidemia, restless leg syndrome, thyroid disease, and overweight state, who presents for follow-up consultation of her obstructive sleep apnea  after interim testing and treatment with a new AutoPap machine.  Her home sleep test from 09/17/2021 indicated overall mild obstructive sleep apnea with an AHI of 7.4/h, O2 nadir 88% with intermittent mild to moderate snoring detected.  She established treatment with a new AutoPap machine on 10/09/2021.  She is compliant with treatment, she is motivated to continue with it, continues to benefit from PAP therapy.  Her apnea scores look good, leak from the fullface mask is very low.  She is commended for treatment adherence and advised to continue with her current settings and her current machine.  She is advised to follow-up routinely in sleep clinic to see one of our nurse practitioners in 1 year, we can offer a MyChart video visit as well.  I answered all her questions today and she was in agreement.  I spent 20 minutes in total face-to-face time and in reviewing records during pre-charting, more than 50% of which was spent in counseling and coordination of care, reviewing test results, reviewing medications and treatment regimen and/or in discussing  or reviewing the diagnosis of OSA, the prognosis and treatment options. Pertinent laboratory and imaging test results that were available during this visit with the patient were reviewed by me and considered in my medical decision making (see chart for details).

## 2021-12-09 DIAGNOSIS — G4733 Obstructive sleep apnea (adult) (pediatric): Secondary | ICD-10-CM | POA: Diagnosis not present

## 2022-01-09 DIAGNOSIS — G4733 Obstructive sleep apnea (adult) (pediatric): Secondary | ICD-10-CM | POA: Diagnosis not present

## 2022-02-06 ENCOUNTER — Encounter: Payer: Self-pay | Admitting: *Deleted

## 2022-02-24 ENCOUNTER — Other Ambulatory Visit: Payer: Self-pay | Admitting: Family Medicine

## 2022-04-09 ENCOUNTER — Encounter: Payer: Self-pay | Admitting: Family Medicine

## 2022-04-11 NOTE — Telephone Encounter (Signed)
1) There are a few alternatives to cholestyramine but they are all around the same price. She can look into the price of colestipol or colesevalam and let us know if any would be better for her. She can also check with her insurance to see if they have something they prefer.  2) Ok to place referral to neuro.  Algis Greenhouse. [name], MD 04/11/2022 3:44 PM

## 2022-04-14 ENCOUNTER — Other Ambulatory Visit: Payer: Self-pay

## 2022-04-14 DIAGNOSIS — G250 Essential tremor: Secondary | ICD-10-CM

## 2022-04-15 ENCOUNTER — Encounter (INDEPENDENT_AMBULATORY_CARE_PROVIDER_SITE_OTHER): Payer: Self-pay

## 2022-04-17 ENCOUNTER — Other Ambulatory Visit: Payer: Self-pay | Admitting: Family Medicine

## 2022-04-18 ENCOUNTER — Other Ambulatory Visit: Payer: Self-pay | Admitting: *Deleted

## 2022-04-18 ENCOUNTER — Encounter: Payer: Self-pay | Admitting: Family Medicine

## 2022-04-18 MED ORDER — CHOLESTYRAMINE 4 G PO PACK: PACK | ORAL | 3 refills | Status: DC

## 2022-04-18 MED ORDER — ESCITALOPRAM OXALATE 20 MG PO TABS: 20.0000 mg | ORAL_TABLET | Freq: Every day | ORAL | 1 refills | Status: DC

## 2022-05-09 ENCOUNTER — Encounter: Payer: Self-pay | Admitting: Family Medicine

## 2022-05-12 ENCOUNTER — Other Ambulatory Visit: Payer: Self-pay

## 2022-05-12 ENCOUNTER — Emergency Department (HOSPITAL_COMMUNITY)
Admission: EM | Admit: 2022-05-12 | Discharge: 2022-05-12 | Disposition: A | Discharge status: Home / Self Care | Payer: BC Managed Care – PPO | Attending: Emergency Medicine | Admitting: Emergency Medicine

## 2022-05-12 ENCOUNTER — Encounter: Payer: Self-pay | Admitting: Family Medicine

## 2022-05-12 ENCOUNTER — Encounter (HOSPITAL_COMMUNITY): Payer: Self-pay

## 2022-05-12 ENCOUNTER — Emergency Department (HOSPITAL_COMMUNITY): Payer: BC Managed Care – PPO

## 2022-05-12 DIAGNOSIS — E860 Dehydration: Secondary | ICD-10-CM | POA: Diagnosis not present

## 2022-05-12 DIAGNOSIS — E039 Hypothyroidism, unspecified: Secondary | ICD-10-CM | POA: Insufficient documentation

## 2022-05-12 DIAGNOSIS — J45909 Unspecified asthma, uncomplicated: Secondary | ICD-10-CM | POA: Diagnosis not present

## 2022-05-12 DIAGNOSIS — R109 Unspecified abdominal pain: Secondary | ICD-10-CM | POA: Diagnosis not present

## 2022-05-12 DIAGNOSIS — R197 Diarrhea, unspecified: Secondary | ICD-10-CM

## 2022-05-12 DIAGNOSIS — Z79899 Other long term (current) drug therapy: Secondary | ICD-10-CM | POA: Diagnosis not present

## 2022-05-12 LAB — URINALYSIS, ROUTINE W REFLEX MICROSCOPIC
Glucose, UA: NEGATIVE mg/dL
Hgb urine dipstick: NEGATIVE
Ketones, ur: 20 mg/dL — AB
Nitrite: NEGATIVE
Protein, ur: 30 mg/dL — AB
Specific Gravity, Urine: 1.02 (ref 1.005–1.030)
pH: 8 (ref 5.0–8.0)

## 2022-05-12 LAB — COMPREHENSIVE METABOLIC PANEL
ALT: 22 U/L (ref 0–44)
AST: 24 U/L (ref 15–41)
Albumin: 4.3 g/dL (ref 3.5–5.0)
Alkaline Phosphatase: 82 U/L (ref 38–126)
Anion gap: 16 — ABNORMAL HIGH (ref 5–15)
BUN: 15 mg/dL (ref 8–23)
CO2: 23 mmol/L (ref 22–32)
Calcium: 10 mg/dL (ref 8.9–10.3)
Chloride: 101 mmol/L (ref 98–111)
Creatinine, Ser: 1.11 mg/dL — ABNORMAL HIGH (ref 0.44–1.00)
GFR, Estimated: 57 mL/min — ABNORMAL LOW (ref >60)
Glucose, Bld: 152 mg/dL — ABNORMAL HIGH (ref 70–99)
Potassium: 4.3 mmol/L (ref 3.5–5.1)
Sodium: 140 mmol/L (ref 135–145)
Total Bilirubin: 0.7 mg/dL (ref 0.3–1.2)
Total Protein: 6.4 g/dL — ABNORMAL LOW (ref 6.5–8.1)

## 2022-05-12 LAB — CBC
HCT: 41.7 % (ref 36.0–46.0)
Hemoglobin: 14.7 g/dL (ref 12.0–15.0)
MCH: 32.6 pg (ref 26.0–34.0)
MCHC: 35.3 g/dL (ref 30.0–36.0)
MCV: 92.5 fL (ref 80.0–100.0)
Platelets: 168 10*3/uL (ref 150–400)
RBC: 4.51 MIL/uL (ref 3.87–5.11)
RDW: 12.1 % (ref 11.5–15.5)
WBC: 12.5 10*3/uL — ABNORMAL HIGH (ref 4.0–10.5)
nRBC: 0 % (ref 0.0–0.2)

## 2022-05-12 LAB — LIPASE, BLOOD: Lipase: 37 U/L (ref 11–51)

## 2022-05-12 MED ORDER — ONDANSETRON 4 MG PO TBDP
4.0000 mg | ORAL_TABLET | Freq: Once | ORAL | Status: AC | PRN
  Administered 2022-05-12: 4 mg via ORAL
  Filled 2022-05-12: qty 1

## 2022-05-12 MED ORDER — ONDANSETRON HCL 4 MG/2ML IJ SOLN
4.0000 mg | Freq: Once | INTRAMUSCULAR | Status: AC
  Administered 2022-05-12: 4 mg via INTRAVENOUS
  Filled 2022-05-12: qty 2

## 2022-05-12 MED ORDER — SODIUM CHLORIDE 0.9 % IV BOLUS (SEPSIS)
1000.0000 mL | Freq: Once | INTRAVENOUS | Status: AC
  Administered 2022-05-12: 1000 mL via INTRAVENOUS

## 2022-05-12 MED ORDER — FENTANYL CITRATE PF 50 MCG/ML IJ SOSY
50.0000 ug | PREFILLED_SYRINGE | Freq: Once | INTRAMUSCULAR | Status: AC
  Administered 2022-05-12: 50 ug via INTRAVENOUS
  Filled 2022-05-12: qty 1

## 2022-05-12 MED ORDER — PANTOPRAZOLE SODIUM 40 MG PO TBEC
40.0000 mg | DELAYED_RELEASE_TABLET | Freq: Every day | ORAL | Status: DC
  Administered 2022-05-12: 40 mg via ORAL
  Filled 2022-05-12: qty 1

## 2022-05-12 MED ORDER — IOHEXOL 350 MG/ML SOLN
75.0000 mL | Freq: Once | INTRAVENOUS | Status: AC | PRN
  Administered 2022-05-12: 75 mL via INTRAVENOUS

## 2022-05-12 NOTE — ED Provider Notes (Signed)
[facility] Provider Note   CSN: [id number] Arrival date & time: 05/12/22  [id number]     History  Chief Complaint  Patient presents with   Abdominal Pain    Dominique [name] is a 62 y.o. female.  The history is provided by the patient.  Patient presents with abdominal pain and diarrhea.  Patient reports starting last night she started having multiple episodes of nonbloody diarrhea.  She reports nausea without any emesis.  She also reports left lower quadrant abdominal pain.  No fevers.  Previous history of D&C and cholecystectomy.  No chest pain or shortness of breath.  No coughing. No recent travel, no recent antibiotics    Past Medical History:  Diagnosis Date   Anemia    on meds   Anxiety    on meds   Asthma    uses inhaler as needed   Blood transfusion without reported diagnosis 1992   post d/c   Cervix prolapsed into vagina    Uro-Gyne Exam 2011, 2014   Depression    on meds   GERD (gastroesophageal reflux disease)    on meds   Hearing loss    High cholesterol    diet controlled-no meds at this time (04/17/2020)   Migraines    hx of   Neuromuscular disorder (HCC)    tremors   Restless leg syndrome    taking iron to support   Sleep apnea    uses CPAP   Thyroid disease    on meds   Tremor     Home Medications Prior to Admission medications   Medication Sig Start Date End Date Taking? Authorizing Provider  albuterol (VENTOLIN HFA) 108 (90 Base) MCG/ACT inhaler Inhale 1 puff into the lungs every 6 (six) hours as needed for wheezing or shortness of breath. 04/17/20   Vivi Barrack, MD  buPROPion (WELLBUTRIN XL) 300 MG 24 hr tablet TAKE 1 TABLET BY MOUTH EVERY DAY 04/18/22   Vivi Barrack, MD  cholestyramine (QUESTRAN) 4 g packet TAKE 1 PACKET BY MOUTH DAILY. 04/18/22   Vivi Barrack, MD  escitalopram (LEXAPRO) 20 MG tablet Take 1 tablet (20 mg total) by mouth daily. 04/18/22   Vivi Barrack, MD  ferrous sulfate  325 (65 FE) MG tablet Take 325 mg by mouth daily.    [provider]  gabapentin (NEURONTIN) 100 MG capsule TAKE 1 CAPSULE BY MOUTH EVERY DAY AT NIGHT 04/18/22   Vivi Barrack, MD  gabapentin (NEURONTIN) 300 MG capsule Take 1 capsule (300 mg total) by mouth at bedtime. TAKE 1 CAPSULE NIGHTLY 09/13/21   Vivi Barrack, MD  levothyroxine (SYNTHROID) 25 MCG tablet TAKE 1 TABLET BY MOUTH EVERY DAY BEFORE BREAKFAST 04/18/22   Vivi Barrack, MD  omeprazole (PRILOSEC) 20 MG capsule Take 1 capsule (20 mg total) by mouth daily. 04/22/21   Vivi Barrack, MD  propranolol (INDERAL) 60 MG tablet TAKE 1 TABLET BY MOUTH TWICE A DAY 04/18/22   Vivi Barrack, MD  VITAMIN D PO Take by mouth.    [provider]      Allergies    Bactrim [sulfamethoxazole-trimethoprim], Elemental sulfur, and Sulfa antibiotics    Review of Systems   Review of Systems  Constitutional:  Negative for fever.  Gastrointestinal:  Positive for diarrhea and nausea. Negative for blood in stool and vomiting.    Physical Exam Updated Vital Signs BP 122/82   Pulse 66  Temp (!) 97.4 F (36.3 C) (Oral)   Resp 20   Ht 1.575 m (5\' 2" )   Wt 68 kg   SpO2 97%   BMI 27.44 kg/m  Physical Exam CONSTITUTIONAL: Well developed/well nourished, no acute distress HEAD: Normocephalic/atraumatic EYES: EOMI/PERRL ENMT: Mucous membranes moist NECK: supple no meningeal signs CV: S1/S2 noted, no murmurs/rubs/gallops noted LUNGS: Lungs are clear to auscultation bilaterally, no apparent distress ABDOMEN: soft, mild LLQ tenderness, no rebound or guarding, bowel sounds noted throughout abdomen GU:no cva tenderness NEURO: Pt is awake/alert/appropriate, moves all extremitiesx4.  No facial droop.   EXTREMITIES: pulses normal/equal, full ROM SKIN: warm, color normal PSYCH: no abnormalities of mood noted, alert and oriented to situation  ED Results / Procedures / Treatments   Labs (all labs ordered are listed, but only  abnormal results are displayed) Labs Reviewed  COMPREHENSIVE METABOLIC PANEL - Abnormal; Notable for the following components:      Result Value   Glucose, Bld 152 (*)    Creatinine, Ser 1.11 (*)    Total Protein 6.4 (*)    GFR, Estimated 57 (*)    Anion gap 16 (*)    All other components within normal limits  CBC - Abnormal; Notable for the following components:   WBC 12.5 (*)    All other components within normal limits  URINALYSIS, ROUTINE W REFLEX MICROSCOPIC - Abnormal; Notable for the following components:   Bilirubin Urine SMALL (*)    Ketones, ur 20 (*)    Protein, ur 30 (*)    Leukocytes,Ua MODERATE (*)    Bacteria, UA FEW (*)    All other components within normal limits  LIPASE, BLOOD    EKG None  Radiology CT ABDOMEN PELVIS W CONTRAST  Result Date: 05/12/2022 CLINICAL DATA:  Acute, nonlocalized abdominal pain EXAM: CT ABDOMEN AND PELVIS WITH CONTRAST TECHNIQUE: Multidetector CT imaging of the abdomen and pelvis was performed using the standard protocol following bolus administration of intravenous contrast. RADIATION DOSE REDUCTION: This exam was performed according to the departmental dose-optimization program which includes automated exposure control, adjustment of the mA and/or kV according to patient size and/or use of iterative reconstruction technique. CONTRAST:  52mL OMNIPAQUE IOHEXOL 350 MG/ML SOLN COMPARISON:  08/13/2019 FINDINGS: Lower chest:  No contributory findings. Hepatobiliary: No focal liver abnormality.Cholecystectomy. Pancreas: Unremarkable. Spleen: Unremarkable. Adrenals/Urinary Tract: Negative adrenals. No hydronephrosis or stone. Unremarkable bladder. Stomach/Bowel:  No obstruction. No appendicitis. Vascular/Lymphatic: No acute vascular abnormality. No mass or adenopathy. Reproductive:No pathologic findings. Other: No ascites or pneumoperitoneum. Musculoskeletal: No acute abnormalities. Bone island appearance in the right superior acetabulum which is  stable from 2021. IMPRESSION: No acute finding. Electronically Signed   By: Jorje Guild M.D.   On: 05/12/2022 04:42    Procedures Procedures    Medications Ordered in ED Medications  ondansetron (ZOFRAN-ODT) disintegrating tablet 4 mg (4 mg Oral Given 05/12/22 0223)  ondansetron (ZOFRAN) injection 4 mg (4 mg Intravenous Given 05/12/22 0354)  sodium chloride 0.9 % bolus 1,000 mL (0 mLs Intravenous Stopped 05/12/22 0424)  fentaNYL (SUBLIMAZE) injection 50 mcg (50 mcg Intravenous Given 05/12/22 0354)  iohexol (OMNIPAQUE) 350 MG/ML injection 75 mL (75 mLs Intravenous Contrast Given 05/12/22 0433)    ED Course/ Medical Decision Making/ A&P Clinical Course as of 05/12/22 0457  Mon May 12, 2022  0333 Glucose(!): 152 Hyperglycemia [DW]  0333 WBC(!): 12.5 Leukocytosis [DW]  0333 Dehydration noted [DW]  [id number] Patient presented with diarrhea left lower quadrant abdominal pain.  She had focal tenderness  on exam and elevated white count.  Decision was made to perform CT imaging No signs of diverticulitis or other acute process. Patient is feeling improved, she is responding to IV fluids and is able to take oral fluids. Suspect viral enteritis.  Patient safe for discharge [DW]    Clinical Course User Index [DW] Ripley Fraise, MD                             Medical Decision Making Amount and/or Complexity of Data Reviewed Labs: ordered. Decision-making details documented in ED Course. Radiology: ordered.  Risk Prescription drug management.   This patient presents to the ED for concern of abdominal pain, this involves an extensive number of treatment options, and is a complaint that carries with it a high risk of complications and morbidity.  The differential diagnosis includes but is not limited to cholecystitis, cholelithiasis, pancreatitis, gastritis, peptic ulcer disease, appendicitis, bowel obstruction, bowel perforation, diverticulitis, AAA, ischemic bowel, Urinary tract infection,  kidney stone Comorbidities that complicate the patient evaluation: Patient's presentation is complicated by their history of tremors, asthma  Social Determinants of Health: Patient with high health literacy, she is a retired pediatrician  Additional history obtained: Additional history obtained from spouse Records reviewed previous admission documents  Lab Tests: I Ordered, and personally interpreted labs.  The pertinent results include: Dehydration, leukocytosis  Imaging Studies ordered: I ordered imaging studies including CT scan abdomen pelvis   I independently visualized and interpreted imaging which showed no acute findings I agree with the radiologist interpretation    Medicines ordered and prescription drug management: I ordered medication including fentanyl and Zofran for abdominal pain and nausea Reevaluation of the patient after these medicines showed that the patient    improved   Reevaluation: After the interventions noted above, I reevaluated the patient and found that they have :improved  Complexity of problems addressed: Patient's presentation is most consistent with  acute presentation with potential threat to life or bodily function  Disposition: After consideration of the diagnostic results and the patient's response to treatment,  I feel that the patent would benefit from discharge   .           Final Clinical Impression(s) / ED Diagnoses Final diagnoses:  Diarrhea of presumed infectious origin  Dehydration    Rx / DC Orders ED Discharge Orders     None         Ripley Fraise, MD 05/12/22 [phone]

## 2022-05-12 NOTE — ED Triage Notes (Signed)
Pt arrived to triage complaining of Nausea and diarrhea that started at 10pm last night. Pt state that she has had 6 episodes of diarrhea and feels like she is going to vomit but has not.   Denies blood in stools

## 2022-05-14 NOTE — Telephone Encounter (Signed)
It looks like she does have some leukocytes and a few bacteria on her UA. They did not send out for a culture. Before we treat with antibiotics can we have her come in or at least drop off a specimen for a urine culture?  Algis Greenhouse. [name], MD 05/14/2022 8:26 AM

## 2022-05-14 NOTE — Telephone Encounter (Signed)
I spoke with the patient advise need ED F/U and or a lab appt for urine culture  Patient stated will callback

## 2022-05-23 DIAGNOSIS — G4733 Obstructive sleep apnea (adult) (pediatric): Secondary | ICD-10-CM | POA: Diagnosis not present

## 2022-05-29 ENCOUNTER — Ambulatory Visit (INDEPENDENT_AMBULATORY_CARE_PROVIDER_SITE_OTHER): Payer: BC Managed Care – PPO | Admitting: Family Medicine

## 2022-05-29 ENCOUNTER — Encounter: Payer: Self-pay | Admitting: Family Medicine

## 2022-05-29 VITALS — BP 108/73 | HR 54 | Temp 97.3°F | Ht 62.0 in | Wt 147.2 lb

## 2022-05-29 DIAGNOSIS — K2100 Gastro-esophageal reflux disease with esophagitis, without bleeding: Secondary | ICD-10-CM

## 2022-05-29 DIAGNOSIS — R8281 Pyuria: Secondary | ICD-10-CM | POA: Diagnosis not present

## 2022-05-29 MED ORDER — FAMOTIDINE 20 MG PO TABS: 20.0000 mg | ORAL_TABLET | Freq: Two times a day (BID) | ORAL | 0 refills | Status: DC

## 2022-05-29 NOTE — Assessment & Plan Note (Signed)
We discussed potential long-term side effects of PPI use. Also discussed complications from untreated GERD including potential esophageal cancer.  She has been having issues with GERD for the last 20 years or so.  She does not believe she has ever been on any other treatments for this.  We will try switching to Pepcid 20 mg twice daily and have her stop the omeprazole.  If symptoms or not controlled on this regimen we will go back to omeprazole.

## 2022-05-29 NOTE — Progress Notes (Signed)
   Dominique [name] is a 62 y.o. female who presents today for an office visit.  Assessment/Plan:  New/Acute Problems: Pyuria Incidentally noted on UA in the ED a couple of weeks ago.  Not currently having any other symptoms of UTI.  Will check urine culture today.  Chronic Problems Addressed Today: GERD (gastroesophageal reflux disease) We discussed potential long-term side effects of PPI use. Also discussed complications from untreated GERD including potential esophageal cancer.  She has been having issues with GERD for the last 20 years or so.  She does not believe she has ever been on any other treatments for this.  We will try switching to Pepcid 20 mg twice daily and have her stop the omeprazole.  If symptoms or not controlled on this regimen we will go back to omeprazole.     Subjective:  HPI:  Patient here for ED follow-up.  Went to ED on 05/12/2022 with abdominal pain and diarrhea.  Ct scan was reassuring.  Given IV fluids and symptoms improved.  Symptoms thought to be due to gastroenteritis.  She was discharged home in stable condition.  She did have UA that showed few bacteria with leukocytes.  She is not having any urinary symptoms currently.  No dysuria.  No frequency.  She is also concerned about potential long-term side effects of omeprazole.       Objective:  Physical Exam: BP 108/73   Pulse (!) 54   Temp (!) 97.3 F (36.3 C) (Temporal)   Ht 5\' 2"  (1.575 m)   Wt 147 lb 3.2 oz (66.8 kg)   SpO2 100%   BMI 26.92 kg/m   Gen: No acute distress, resting comfortably CV: Regular rate and rhythm with no murmurs appreciated Pulm: Normal work of breathing, clear to auscultation bilaterally with no crackles, wheezes, or rhonchi Neuro: Grossly normal, moves all extremities Psych: Normal affect and thought content      Mirenda Baltazar M. [name], MD 05/29/2022 9:21 AM

## 2022-05-29 NOTE — Patient Instructions (Signed)
It was very nice to see you today!  We will check a urine culture today.  Please try the famotidine instead of the omeprazole.  Send me a message in a few weeks to let me know how this is working for you.  Take care, Dr [name]  PLEASE NOTE:  If you had any lab tests, please let us know if you have not heard back within a few days. You may see your results on mychart before we have a chance to review them but we will give you a call once they are reviewed by [place].   If we ordered any referrals today, please let us know if you have not heard from their office within the next week.   If you had any urgent prescriptions sent in today, please check with the pharmacy within an hour of our visit to make sure the prescription was transmitted appropriately.   Please try these tips to maintain a healthy lifestyle:  Eat at least 3 REAL meals and 1-2 snacks per day.  Aim for no more than 5 hours between eating.  If you eat breakfast, please do so within one hour of getting up.   Each meal should contain half fruits/vegetables, one quarter protein, and one quarter carbs (no bigger than a computer mouse)  Cut down on sweet beverages. This includes juice, soda, and sweet tea.   Drink at least 1 glass of water with each meal and aim for at least 8 glasses per day  Exercise at least 150 minutes every week.

## 2022-05-30 LAB — URINE CULTURE
MICRO NUMBER:: 14783128
SPECIMEN QUALITY:: ADEQUATE

## 2022-06-02 NOTE — Progress Notes (Signed)
Please inform patient of the following:  Urine culture is negative. She does not have a UTI.  Dominique [name]. Jimmey Ralph, MD 06/02/2022 1:18 PM

## 2022-06-16 ENCOUNTER — Encounter: Payer: Self-pay | Admitting: Family Medicine

## 2022-07-15 ENCOUNTER — Ambulatory Visit: Payer: BC Managed Care – PPO | Admitting: Neurology

## 2022-07-15 ENCOUNTER — Encounter: Payer: Self-pay | Admitting: Neurology

## 2022-07-15 VITALS — BP 103/67 | HR 54 | Ht 62.0 in | Wt 145.8 lb

## 2022-07-15 DIAGNOSIS — Z820 Family history of epilepsy and other diseases of the nervous system: Secondary | ICD-10-CM | POA: Diagnosis not present

## 2022-07-15 DIAGNOSIS — R001 Bradycardia, unspecified: Secondary | ICD-10-CM

## 2022-07-15 DIAGNOSIS — G250 Essential tremor: Secondary | ICD-10-CM | POA: Diagnosis not present

## 2022-07-15 MED ORDER — PRIMIDONE 50 MG PO TABS: 100.0000 mg | ORAL_TABLET | Freq: Every day | ORAL | 5 refills | Status: DC

## 2022-07-15 NOTE — Progress Notes (Signed)
Subjective:    Patient ID: Dominique [name] is a 62 y.o. female.  HPI    History:   Dear Dr. Jimmey Ralph,   I saw your patient, Dominique [name], upon your request in my clinic today for initial consultation of her tremors.  The patient is unaccompanied today.  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  With time, her tremor has become worse.  She feels that it interferes with her ability to feed herself primarily.  Some days are worse than others, when she is particularly physically active she will notice worsening.  She tries to hydrate well with water.  She has been followed for essential tremor by Dr. Arbutus Leas previously but a recent follow-up appointment at her office was declined for unclear reasons. She reports, that she tried to make an appointment with Dr. Arbutus Leas but was told to come see me for it. The patient has been on propranolol, currently at 60 mg twice daily.  She has been on gabapentin for symptomatic relief of her tremors, currently at 300 mg daily at bedtime.  This was recently increased from 100 mg daily.  She has never been on propranolol.  She has not discussed DBS with Dr. Arbutus Leas in the past but has seen something online about [employer].  Of note, she is on Lexapro, 20 mg daily.  She has has had a heart rate in the higher 40s and lower 50s at times. She reports that her dad who is 62 years old has a tremor.  His younger sister does not have a tremor, older sister passed away and probably did not have a tremor as far as patient's recollection.  Patient has 2 younger siblings, 1 brother, 1 sister, neither 1 has a tremor as far she knows.  She herself has 3  children, her son age 2 may have a hand tremor as she recently asked him.  With time she has developed a head tremor.  It does not bother her very much.  She tries to limit her caffeine, generally drinks 1 cup of chai in the morning and 1 soda per day.  She drinks alcohol about 3 times a week, usually 1 drink, has not seen much in the way of improvement of her tremor with alcohol consumption.   I have seen her in my sleep clinic for her diagnosis of sleep apnea.  She was last seen in the sleep clinic on 12/04/2021, at which time she was fully compliant with her AutoPap of 6 to 10 cm with good apnea control and good results.     The patient's allergies, current medications, family history, past medical history, past social history, past surgical history and problem list were reviewed and updated as appropriate.    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  The patient is unaccompanied today.  I first met her on 07/25/2021 at the request of her primary care physician, at which time she reported a  prior diagnosis of sleep apnea, she had an order CPAP machine, she was compliant with it.  She was advised to proceed with a sleep study.  She had a home sleep test on 09/17/2021 which indicated overall mild obstructive sleep apnea with an AHI of 7.4/h, O2 nadir 88% with intermittent mild to moderate snoring detected.  She was advised to continue with positive airway pressure treatment at home and I wrote for a new AutoPap machine.  Her set up date was 10/09/2021.  She has a ResMed air sense 11 AutoSet machine.   I reviewed her AutoPap compliance data  from 11/03/2021 through 12/02/2021, which is a total of 30 days, during which time she used her machine every night with percent used days greater than 4 hours at 100%, indicating superb compliance with an average usage of 8 hours and 16 minutes, residual AHI at goal at 2.0/h, 95th percentile of pressure at 9.7 cm with a range of 6 to 10 cm with EPR, low leak with the 95th percentile at 0.5 L/min.  She reports doing well with her new machine has adjusted well to AutoPap therapy and is compliant with treatment and continues to benefit from treatment. She is motivated to continue at the same settings, she uses a fullface mask.   07/25/21: (She) was diagnosed with obstructive sleep apnea and placed on PAP therapy several years ago.  Prior sleep study results are not available for my review today, testing was about 8 years ago.  She has an older machine, at least 47 or 62 years old.  Machine has shown an error message that the motor life has been exceeded.  I reviewed her office note from 05/15/2021 as well as MyChart message encounter from 07/04/2021.  Her Epworth sleepiness score is 8 out of 24, fatigue severity score is 34 out of 63.  I was able to PAP compliance data from the past 90 days, from 04/26/2021 through 07/24/2021, during which time she used her machine 89 days with percent use days greater than 4 hours at 96% currently with an average usage of 8 hours and 18 minutes, residual AHI borderline at 4.4/h, leak in the low side with the 95th percentile at 1.9 L/min, pressure of 8 cm with EPR of 3.  In the past 2 weeks she has had some decline in usage. She reports that she had acknowledgment of cough and congestion and was not always able to use her machine, other than that she has benefited from treatment in these past years, in particular with regards to improvement of her sleep consolidation, sleep quality and daytime somnolence as well as morning headaches.  She is married and lives with her husband.  She has 3 grown  children.  She has been having trouble getting new supplies, she uses a fullface mask. They have 1 dog in the household.  Bedtime is generally between 11 and midnight, rise time between 8 and 9.  She drinks caffeine in the form of soda, 1 serving per day and 1 serving of milk tea/chai.  She does not have night to night nocturia.  Her brother has sleep apnea.  She has had a fairly stable weight.     Her Past Medical History Is Significant For: Past Medical History:  Diagnosis Date   Anemia    on meds   Anxiety    on meds   Asthma    uses inhaler as needed   Blood transfusion without reported diagnosis 1992   post d/c  Cervix prolapsed into vagina    Uro-Gyne Exam 2011, 2014   Depression    on meds   GERD (gastroesophageal reflux disease)    on meds   Hearing loss    High cholesterol    diet controlled-no meds at this time (04/17/2020)   Migraines    hx of   Neuromuscular disorder (HCC)    tremors   Restless leg syndrome    taking iron to support   Sleep apnea    uses CPAP   Thyroid disease    on meds   Tremor     Her Past Surgical History Is Significant For: Past Surgical History:  Procedure Laterality Date   CHOLECYSTECTOMY N/A 08/14/2019   Procedure: LAPAROSCOPIC CHOLECYSTECTOMY;  Surgeon: Kinsinger, De Blanch, MD;  Location: [facility] OR;  Service: General;  Laterality: N/A;   COLONOSCOPY  2015   with EGD at [facility]   DILATION AND CURETTAGE OF UTERUS  02/24/1990   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     MOUTH SURGERY     3 teeth removed    ROOT CANAL     2 times    VAGINAL DELIVERY     1986   WISDOM TOOTH EXTRACTION      Her Family History Is Significant For: Family History  Problem Relation Age of Onset   Hypertension Mother    High Cholesterol Mother    Cataracts Mother    Retinal detachment Mother    Colon cancer Mother 23   Colon polyps Mother 3   Hypertension Brother    High Cholesterol Brother    Sleep apnea Brother    Heart disease Maternal Uncle     Diabetes Paternal Aunt    Breast cancer Paternal Aunt    Colon polyps Paternal Grandmother    Diabetes Paternal Grandfather    Arthritis Paternal Grandfather    Depression Daughter    Menstrual problems Daughter    Anxiety disorder Daughter    ADD / ADHD Son    Depression Son    Anxiety disorder Son    Breast cancer Cousin    Esophageal cancer Neg Hx    Stomach cancer Neg Hx    Rectal cancer Neg Hx    Tremor Neg Hx    Parkinson's disease Neg Hx     Her Social History Is Significant For: Social History   Socioeconomic History   Marital status: Married    Spouse name: Not on file   Number of children: 3   Years of education: Not on file   Highest education level: Not on file  Occupational History   Occupation: retired    Comment: pediatrician  Tobacco Use   Smoking status: Never   Smokeless tobacco: Never  Vaping Use   Vaping Use: Never used  Substance and Sexual Activity   Alcohol use: Not Currently    Alcohol/week: 2.0 standard drinks of alcohol    Types: 2 Shots of liquor per week    Comment: occ   Drug use: No   Sexual activity: Not Currently  Other Topics Concern   Not on file  Social History Narrative   As of 11/11/16   Diet: Minimal red meat, lactose intolerance       Caffeine: Yes      Married, if yes what year: Yes, 2006      Do you live in a house, apartment, assisted living, condo, trailer, ect: House, 2 stories, 2 persons      Pets: 1 dog  Current/Past profession: Pediatrician       Exercise: A little, walking dog and elliptical          Living Will: No   DNR: No   POA/HPOA: No      Functional Status:   Do you have difficulty bathing or dressing yourself? No   Do you have difficulty preparing food or eating? No   Do you have difficulty managing your medications? No    Do you have difficulty managing your finances? No    Do you have difficulty affording your medications? No       Right handed   Lives in two story home with husband  and dog   Social Determinants of Health   Financial Resource Strain: Not on file  Food Insecurity: Not on file  Transportation Needs: Not on file  Physical Activity: Not on file  Stress: Not on file  Social Connections: Not on file    Her Allergies Are:  Allergies  Allergen Reactions   Bactrim [Sulfamethoxazole-Trimethoprim] Rash   Elemental Sulfur Rash   Sulfa Antibiotics Rash  :   Her Current Medications Are:  Outpatient Encounter Medications as of 07/15/2022  Medication Sig   albuterol (VENTOLIN HFA) 108 (90 Base) MCG/ACT inhaler Inhale 1 puff into the lungs every 6 (six) hours as needed for wheezing or shortness of breath.   buPROPion (WELLBUTRIN XL) 300 MG 24 hr tablet TAKE 1 TABLET BY MOUTH EVERY DAY   cholestyramine (QUESTRAN) 4 g packet TAKE 1 PACKET BY MOUTH DAILY.   escitalopram (LEXAPRO) 20 MG tablet Take 1 tablet (20 mg total) by mouth daily.   ferrous sulfate 325 (65 FE) MG tablet Take 325 mg by mouth daily.   gabapentin (NEURONTIN) 300 MG capsule Take 1 capsule (300 mg total) by mouth at bedtime. TAKE 1 CAPSULE NIGHTLY   levothyroxine (SYNTHROID) 25 MCG tablet TAKE 1 TABLET BY MOUTH EVERY DAY BEFORE BREAKFAST   omeprazole (PRILOSEC) 20 MG capsule Take 20 mg by mouth daily.   propranolol (INDERAL) 60 MG tablet TAKE 1 TABLET BY MOUTH TWICE A DAY   VITAMIN D PO Take by mouth.   famotidine (PEPCID) 20 MG tablet Take 1 tablet (20 mg total) by mouth 2 (two) times daily.   gabapentin (NEURONTIN) 100 MG capsule TAKE 1 CAPSULE BY MOUTH EVERY DAY AT NIGHT   No facility-administered encounter medications on file as of 07/15/2022.  :  Review of Systems:  Out of a complete 14 point review of systems, all are reviewed and negative with the exception of these symptoms as listed below:  Review of Systems  Neurological:        Pt here for tremors Pt states tremors in both hands and head     Objective:  Neurological Exam  Physical Exam Physical Examination:   Vitals:    07/15/22 1506  BP: 103/67  Pulse: (!) 54    General Examination: The patient is a very pleasant 62 y.o. female in no acute distress. She appears well-developed and well-nourished and well groomed.   HEENT: Normocephalic, atraumatic, pupils are equal, round and reactive to light, extraocular tracking is good without limitation to gaze excursion or obvious nystagmus noted.  Corrective eyeglasses in place.  Hearing is grossly intact. Face is symmetric with normal facial animation. Speech is clear with no dysarthria noted. There is no hypophonia. There is a mild but fairly consistent head tremor, slight intermittent voice tremor noted as well.  Neck with full range of motion. There are  no carotid bruits on auscultation. Oropharynx exam reveals: No significant mouth dryness, good dental hygiene, moderate airway crowding.  Tongue protrudes centrally and palate elevates symmetrically.  Good tongue movements.   Chest: Clear to auscultation without wheezing, rhonchi or crackles noted.   Heart: S1+S2+0, regular and normal without murmurs, rubs or gallops noted.    Abdomen: Soft, non-tender and non-distended.   Extremities: There is no obvious edema in the left more than right ankle.      Skin: Warm and dry without trophic changes noted.    Musculoskeletal: exam reveals no obvious joint deformities.    Neurologically:  Mental status: The patient is awake, alert and oriented in all 4 spheres. Her immediate and remote memory, attention, language skills and fund of knowledge are appropriate. There is no evidence of aphasia, agnosia, apraxia or anomia. Speech is clear with normal prosody and enunciation. Thought process is linear. Mood is normal and affect is normal.  Cranial nerves II - XII are as described above under HEENT exam.  Motor exam: Normal bulk, strength and tone is noted. There is no obvious resting tremor.  No drift or rebound.  She has a mild bilateral postural tremor and mild action tremor  in her hands.  No lower extremity tremor noted.  Fine motor skills and coordination: intact finger taps, hand movements and rapid alternating patting in both upper extremities, good foot taps bilaterally..  Cerebellar testing: No dysmetria or intention tremor. There is no truncal or gait ataxia.  Normal finger-to-nose, normal heel-to-shin bilaterally. Romberg is negative.  More pronounced head tremor with eyes closed. Sensory exam: intact to light touch in the upper and lower extremities.  Gait, station and balance: She stands easily. No veering to one side is noted. No leaning to one side is noted. Posture is age-appropriate and stance is narrow based. Gait shows normal stride length and normal pace. No problems turning are noted.  Tandem walk is slightly challenging but doable.   Assessment and plan:  In summary, Vegas Rittner is a very pleasant 62 year old female with an underlying medical history of anemia, asthma, anxiety, depression, reflux disease, essential tremor, hyperlipidemia, restless leg syndrome, thyroid disease, sleep apnea on AutoPap therapy and overweight state, who presents for evaluation of her essential tremor of many years duration.  She has a positive family history of essential tremor affecting her father.  She has been on propranolol for the past several years.  She is currently on 60 mg twice daily.  I would not recommend increasing the dose because she already has bradycardia.  She has recently started a higher dose of gabapentin, currently at 300 mg daily, she can continue to take this as an adjunct.   I recommend that we start her on low-dose primidone, 50 mg strength with gradual titration, starting at half a pill at bedtime with biweekly increases to up to 100 mg at bedtime for now in the next few weeks.  We talked about expectations and limitations, possible common side effects.  She is advised that appendicular tremors are often easier to treat and better responsive to  symptomatic medications than midline tremors such as head tremor and voice tremor.  She may be a candidate for deep brain stimulation down the road, we talked about [employer] which is a neuromodulation device, she may be a candidate for this as well in the near future. For now, she will continue with her current medications and add primidone with gradual titration.  We will plan  to follow-up as scheduled for October 2024, she is already scheduled for her sleep apnea follow-up at the time.  She is encouraged to stay in touch via MyChart messaging.  I provided instructions in her MyChart after visit summary and sent a new prescription to her pharmacy.  I answered all her questions today and she was in agreement with our plan.   I spent 40 minutes in total face-to-face time and in reviewing records during pre-charting, more than 50% of which was spent in counseling and coordination of care, reviewing test results, reviewing medications and treatment regimen and/or in discussing or reviewing the diagnosis of ET, the prognosis and treatment options. Pertinent laboratory and imaging test results that were available during this visit with the patient were reviewed by me and considered in my medical decision making (see chart for details).

## 2022-07-15 NOTE — Patient Instructions (Signed)
   Please remember, that any kind or type of tremor may be exacerbated (i.e. get worse) by stress, pain, anxiety, anger, nervousness, excitement, thyroid disease, dehydration or suboptimal hydration, sleep disturbances and sleep deprivation, by caffeine, and low blood sugar values or blood sugar fluctuations as well as thyroid disease, particularly over function of the thyroid gland. Some medications, especially some antidepressants and lithium and valproic acid can cause or exacerbate tremors.   You are already on a beta-blocker.  As discussed, we will start you on a low-dose of a new medication called Mysoline (primidone) 50 mg strength: Take 1/2 pill each bedtime for 2 weeks, then 1 pill each bedtime for 2 weeks, then 1 1/2 pills each bedtime for 2 weeks, then 2 pills each bedtime thereafter. Common side effects reported are: Sleepiness, drowsiness, balance problems, confusion, and GI related symptoms.   Please remember, there are very few medications that symptomatically help with tremor reduction, none are without potential side effects.  You can continue with your current dose of propranolol and gabapentin. You may be a candidate for DBS down the road.  This is a surgical treatment as you know.  We may consider Berniece Andreas Trio in the future.  This is a noninvasive and nonpharmacological (i.e. contains no medication) treatment with so-called neuromodulation.

## 2022-07-28 ENCOUNTER — Encounter: Payer: Self-pay | Admitting: Neurology

## 2022-07-31 ENCOUNTER — Other Ambulatory Visit: Payer: Self-pay | Admitting: Family Medicine

## 2022-07-31 DIAGNOSIS — Z1231 Encounter for screening mammogram for malignant neoplasm of breast: Secondary | ICD-10-CM

## 2022-08-05 ENCOUNTER — Ambulatory Visit
Admission: RE | Admit: 2022-08-05 | Discharge: 2022-08-05 | Disposition: A | Discharge status: Home / Self Care | Payer: BC Managed Care – PPO | Source: Ambulatory Visit | Attending: Family Medicine | Admitting: Family Medicine

## 2022-08-05 DIAGNOSIS — Z1231 Encounter for screening mammogram for malignant neoplasm of breast: Secondary | ICD-10-CM

## 2022-09-15 ENCOUNTER — Encounter: Payer: Self-pay | Admitting: Family Medicine

## 2022-09-16 NOTE — Telephone Encounter (Signed)
Ok to send in a prednisone  burst 50 mg daily x 5 days per month over her husband.  Recommend office visit if still not improving.  We can discuss maintenance inhaler at that time.  Katina Degree. Jimmey Ralph, MD 09/16/2022 12:28 PM

## 2022-09-17 ENCOUNTER — Other Ambulatory Visit: Payer: Self-pay | Admitting: *Deleted

## 2022-09-17 MED ORDER — PREDNISONE 50 MG PO TABS: 50.0000 mg | ORAL_TABLET | Freq: Every day | ORAL | 0 refills | Status: DC

## 2022-09-30 ENCOUNTER — Encounter: Payer: Self-pay | Admitting: Neurology

## 2022-10-13 ENCOUNTER — Other Ambulatory Visit: Payer: Self-pay | Admitting: Family Medicine

## 2022-10-15 ENCOUNTER — Other Ambulatory Visit: Payer: Self-pay | Admitting: Family Medicine

## 2022-11-06 ENCOUNTER — Other Ambulatory Visit: Payer: Self-pay | Admitting: Family Medicine

## 2022-11-10 ENCOUNTER — Encounter: Payer: Self-pay | Admitting: Neurology

## 2022-11-11 ENCOUNTER — Encounter: Payer: Self-pay | Admitting: Family Medicine

## 2022-11-12 NOTE — Telephone Encounter (Signed)
See note

## 2022-11-12 NOTE — Telephone Encounter (Signed)
Appreciate the update.  I am okay with changing that to 300 mg daily.  Please send a new prescription.  She should follow-up with [place] in a few weeks to let us know how she is doing.

## 2022-11-14 NOTE — Telephone Encounter (Signed)
Please have her schedule a visit or clarify the dosage.  I am fine with her decreasing the dose to 100 mg twice daily.  Please send in a new prescription for 100 mg twice daily if needed.  Please have her follow up with [place] in a few weeks.  Dominique [name]. Jimmey Ralph, MD 11/14/2022 11:15 AM

## 2022-11-14 NOTE — Telephone Encounter (Signed)
Please advise 

## 2022-11-18 ENCOUNTER — Other Ambulatory Visit: Payer: Self-pay | Admitting: *Deleted

## 2022-11-18 MED ORDER — GABAPENTIN 100 MG PO CAPS: 100.0000 mg | ORAL_CAPSULE | Freq: Two times a day (BID) | ORAL | 1 refills | Status: DC

## 2022-12-03 ENCOUNTER — Telehealth: Payer: BC Managed Care – PPO | Admitting: Neurology

## 2022-12-03 DIAGNOSIS — G250 Essential tremor: Secondary | ICD-10-CM

## 2022-12-03 DIAGNOSIS — G4733 Obstructive sleep apnea (adult) (pediatric): Secondary | ICD-10-CM

## 2022-12-03 NOTE — Patient Instructions (Signed)
Keep your medication regimen for your tremor the same.  Please continue using AutoPap as well as you are.  Follow-up routinely in 1 year, we can consider referral for DBS consultation versus focused ultrasound treatment for essential tremor in the near future, let me know if you would like to get a referral.

## 2022-12-03 NOTE — Progress Notes (Signed)
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  The patient is unaccompanied today and joins via laptop from home.  I am located in my office at [facility] utilizing my work Health and safety inspector.  Today, 12/03/2022: She reports feeling fairly stable, her midline tremor bothers her more than her hand tremor.  She is taking gabapentin 100 mg twice daily, typically 1 in the afternoon and 1 in the evening.  She is taking propranolol 60 mg twice daily and primidone 100 mg at bedtime.  She reports good tolerance of the medication.  She is compliant with her AutoPap, I was able to review her compliance data for the past 30 days between 11/02/2022 and 12/01/2022, she used her machine every night with percent use days greater than 4 hours at 97%, average usage of 8 hours and 11 minutes, residual AHI at goal at 1.7/h, 95th percentile of pressure at 9.6, leak on the low side with the 95th percentile at 0.8 L/min.   The patient's allergies, current medications, family history, past medical history, past social history, past surgical history and problem list were reviewed and updated as appropriate.    Previously:   07/15/2022: (She) reports a longstanding history of hand tremors, probably dating back to her teenage years even.  With time, her tremor has become worse.  She feels that it interferes with her ability to feed herself primarily.  Some days are worse than others, when she is particularly physically active she will notice worsening.  She tries to hydrate well with water.  She has been followed for essential tremor by Dr. Arbutus Leas  previously but a recent follow-up appointment at her office was declined for unclear reasons. She reports, that she tried to make an appointment with Dr. Arbutus Leas but was told to come see me for it. The patient has been on propranolol, currently at 60 mg twice daily.  She has been on gabapentin for symptomatic relief of her tremors, currently at 300 mg daily at bedtime.  This was recently increased from 100 mg daily.  She has never been on propranolol.  She has not discussed DBS with Dr. Arbutus Leas in the past but has seen something online about [employer].   Of note, she is on Lexapro, 20 mg daily.  She has has had a heart rate in the higher 40s and lower 50s at times. She reports that her dad who is 60 years old has a tremor.  His younger sister does not have a tremor, older sister passed away and probably did not have a tremor as far as patient's recollection.  Patient has 2 younger siblings, 1 brother, 1 sister, neither 1 has a tremor as far she knows.  She herself has 3 children, her son age 4 may have a hand tremor as she recently asked him.  With time she has developed a head tremor.  It does not bother her very much.  She tries to limit her caffeine, generally drinks 1 cup of chai in the morning and 1 soda per day.  She drinks alcohol about 3 times a week, usually 1 drink, has not seen much in the way of improvement of her tremor with alcohol consumption.  I have seen her in my sleep clinic for her diagnosis of sleep apnea.  She was last seen in the sleep clinic on 12/04/2021, at which time she was fully compliant with her AutoPap of 6 to 10 cm with good apnea control and good results.       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  The patient is unaccompanied today.  I first met her on 07/25/2021 at the request of her primary care physician, at which time she reported a prior diagnosis of sleep apnea, she had an order CPAP machine, she was compliant with it.  She was advised to proceed with a sleep study.  She had a home sleep test on 09/17/2021 which indicated overall mild obstructive sleep apnea with an AHI of 7.4/h, O2 nadir 88% with intermittent mild to moderate snoring detected.  She was advised to continue with positive airway pressure treatment at home and I wrote for a new AutoPap machine.  Her set up date was 10/09/2021.  She has a ResMed air sense 11 AutoSet machine.   I reviewed her AutoPap compliance data from 11/03/2021 through 12/02/2021, which is a total of 30 days, during which time she used her machine every night with percent used days greater than 4 hours at 100%, indicating superb compliance with an average usage of 8 hours and 16 minutes, residual AHI at goal at 2.0/h, 95th percentile of pressure at 9.7 cm with a range of 6 to 10 cm with EPR, low leak with the 95th percentile at 0.5 L/min.  She reports doing well with her new machine has adjusted well to AutoPap therapy and is compliant with treatment and continues to benefit from treatment. She is motivated to continue at the same settings, she uses a fullface mask.   07/25/21: (She) was diagnosed with obstructive sleep apnea and placed on PAP therapy several years ago.  Prior sleep study results are not available for my review today, testing was about 8 years ago.  She has an older machine, at least 62 or 62 years old.  Machine has shown an error message that the motor life has been exceeded.  I reviewed her office note from 05/15/2021 as well as MyChart message encounter from 07/04/2021.  Her Epworth sleepiness score is 8 out of 24, fatigue severity score is 34 out of 63.  I was able to PAP  compliance data from the past 90 days, from 04/26/2021 through 07/24/2021, during which time she used her machine 89 days with percent use days greater than 4 hours at 96% currently with an average usage of 8 hours and 18 minutes, residual AHI borderline at 4.4/h, leak in the low side with the 95th percentile at 1.9 L/min, pressure of 8 cm with EPR of 3.  In the past 2 weeks she has had some decline in usage. She reports that she had acknowledgment of cough and congestion and was not always able to use her machine, other than that she has benefited from treatment in these past years, in particular with regards to improvement of her sleep consolidation, sleep quality and daytime somnolence as well as morning headaches.  She is married and lives with her husband.  She has 3 grown children.  She has been having trouble getting new supplies, she uses a fullface mask. They have 1 dog in the household.  Bedtime is generally between 11 and midnight, rise time between 8 and 9.  She drinks caffeine in the form of soda, 1 serving per day and 1 serving of milk tea/chai.  She does not have night to night nocturia.  Her brother has sleep apnea.  She has had a fairly stable weight.   Her Past Medical History Is Significant For: Past Medical History:  Diagnosis Date   Anemia    on meds   Anxiety    on meds   Asthma    uses inhaler as needed   Blood transfusion without reported diagnosis 1992   post d/c   Cervix prolapsed into vagina    Uro-Gyne Exam 2011, 2014   Depression    on meds   GERD (gastroesophageal reflux disease)    on meds   Hearing loss    High cholesterol    diet controlled-no meds at this time (04/17/2020)   Migraines    hx of   Neuromuscular disorder (HCC)    tremors   Restless leg syndrome    taking iron to support   Sleep apnea    uses CPAP   Thyroid disease    on meds   Tremor     Her Past Surgical History Is Significant For: Past Surgical History:  Procedure Laterality Date    CHOLECYSTECTOMY N/A 08/14/2019   Procedure: LAPAROSCOPIC CHOLECYSTECTOMY;  Surgeon: Kinsinger, De Blanch, MD;  Location: [facility] OR;  Service: General;  Laterality: N/A;   COLONOSCOPY  2015   with EGD at [facility]   DILATION AND CURETTAGE OF UTERUS  02/24/1990   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     MOUTH SURGERY     3 teeth removed    ROOT CANAL     2 times    VAGINAL DELIVERY     1986   WISDOM TOOTH EXTRACTION      Her Family History Is Significant For: Family History  Problem Relation Age of Onset   Hypertension Mother    High Cholesterol Mother    Cataracts Mother    Retinal detachment Mother    Colon cancer Mother 32   Colon polyps Mother 79   Hypertension Brother    High Cholesterol Brother    Sleep apnea Brother    Heart disease Maternal Uncle    Diabetes Paternal Aunt    Breast cancer Paternal Aunt    Colon polyps Paternal Grandmother    Diabetes Paternal Grandfather    Arthritis Paternal Grandfather    Depression Daughter    Menstrual problems Daughter    Anxiety disorder Daughter    ADD / ADHD Son    Depression Son    Anxiety disorder Son    Breast cancer Cousin    Esophageal cancer Neg Hx    Stomach cancer Neg Hx    Rectal cancer Neg Hx    Tremor Neg Hx    Parkinson's disease Neg Hx     Her Social History Is Significant For: Social History   Socioeconomic History   Marital status: Married    Spouse name: Not on file   Number of children: 3   Years of education: Not on file   Highest education level: Not on file  Occupational History   Occupation: retired    Comment: pediatrician  Tobacco Use   Smoking status: Never   Smokeless tobacco: Never  Vaping Use   Vaping status: Never Used  Substance and Sexual Activity   Alcohol use: Not Currently    Alcohol/week: 2.0 standard drinks of alcohol    Types: 2 Shots of liquor per week    Comment: occ   Drug use: No   Sexual activity: Not Currently  Other Topics Concern   Not on file  Social History  Narrative   As of 11/11/16   Diet: Minimal red meat, lactose intolerance       Caffeine: Yes      Married, if yes what year: Yes, 2006      Do you live in a house, apartment, assisted living, condo, trailer, ect: House, 2 stories, 2 persons      Pets: 1 dog      Current/Past profession: Pediatrician       Exercise: A little, walking dog and elliptical          Living Will: No   DNR: No   POA/HPOA: No      Functional Status:   Do you have difficulty bathing or dressing yourself? No   Do you have difficulty preparing food or eating? No   Do you have difficulty managing your medications? No    Do you have difficulty managing your finances? No    Do you have difficulty affording your medications? No       Right handed   Lives in two story home with husband and dog   Social Determinants of Health   Financial Resource Strain: Not on file  Food Insecurity: Not on file  Transportation Needs: Not on file  Physical Activity: Not on file  Stress: Not on file  Social Connections: Not on file    Her Allergies Are:  Allergies  Allergen Reactions   Bactrim [Sulfamethoxazole-Trimethoprim] Rash   Elemental Sulfur Rash   Sulfa Antibiotics Rash  :   Her Current Medications Are:  Outpatient Encounter Medications as of 12/03/2022  Medication Sig   albuterol (VENTOLIN HFA) 108 (90 Base) MCG/ACT inhaler Inhale 1 puff into the lungs every 6 (six) hours as needed for wheezing or shortness of breath.   buPROPion (WELLBUTRIN XL) 300 MG 24 hr tablet TAKE 1 TABLET BY MOUTH EVERY DAY   cholestyramine (QUESTRAN) 4 g packet TAKE 1 PACKET BY MOUTH DAILY.   escitalopram (LEXAPRO) 20 MG tablet Take 1 tablet (20 mg total) by mouth daily.   famotidine (PEPCID) 20 MG tablet Take 1 tablet (20 mg total) by mouth 2 (two) times daily.   ferrous sulfate 325 (65 FE) MG tablet Take 325 mg by mouth daily.   gabapentin (NEURONTIN) 100 MG capsule Take 1 capsule (100 mg total) by mouth 2 (two) times daily.    levothyroxine (SYNTHROID) 25 MCG tablet TAKE 1 TABLET BY MOUTH EVERY DAY BEFORE BREAKFAST   omeprazole (PRILOSEC) 20 MG capsule Take 20 mg by mouth daily.   predniSONE (DELTASONE) 50 MG tablet Take 1 tablet (50 mg total) by mouth daily.   primidone (MYSOLINE) 50 MG tablet Take 2 tablets (100 mg total) by mouth at bedtime. Follow titration instructions provided separately in after visit summary.   propranolol (INDERAL) 60 MG tablet TAKE 1 TABLET BY MOUTH TWICE A DAY   VITAMIN D PO Take by mouth.   No facility-administered encounter medications on file as of 12/03/2022.  :  Review of Systems:  Out of a complete 14 point review of systems, all are reviewed and negative with the exception of these symptoms as  listed below:  Virtual Visit via Video Note on 12/03/2022:   I connected with Eiman Maret on 12/03/22 at 10:45 AM EDT by a video enabled telemedicine application and verified that I am speaking with the correct person using two identifiers.   I discussed the limitations of evaluation and management by telemedicine and the availability of in person appointments. The patient expressed understanding and agreed to proceed.  History of Present Illness: See above.   Observations/Objective: Pleasant, conversant, no acute distress, face is symmetric, mild head-bobbing noted.  No obvious voice tremor, able to speak in full sentences, able to stand and turn without problems, no limitation in movements noted in the upper body.   Assessment and Plan: In summary, Dominique [name] is a very pleasant 62 year old female with an underlying medical history of essential tremor, anemia, asthma, obstructive sleep apnea, on AutoPap therapy, reflux disease, hyperlipidemia, migraine headaches, restless legs syndrome, thyroid disease, anxiety and depression, and borderline overweight state, who presents for a MyChart video visit for follow-up consultation of her tremor and sleep apnea.   She is compliant with  her AutoPap therapy.  She is commended for treatment adherence.  She has reasonable tremor control with a combination of propranolol 60 mg twice daily, gabapentin 100 mg twice daily, and primidone 100 mg at bedtime.  We talked about the possibility of pursuing consultation for DBS versus focused ultrasound treatment.  She would like to think about it.  I offered her a referral potentially to [facility], [facility], or [facility].  She is going to do some reading on these 2 treatment possibilities.  In the past, she had seen Dr. Arbutus Leas for her tremor but DBS was not discussed per patient. She is advised to follow-up routinely in this clinic to see one of our nurse practitioners in 1 year.  I answered all her questions today and she was in agreement.  Follow Up Instructions:    I discussed the assessment and treatment plan with the patient. The patient was provided an opportunity to ask questions and all were answered. The patient agreed with the plan and demonstrated an understanding of the instructions.   The patient was advised to call back or seek an in-person evaluation if the symptoms worsen or if the condition fails to improve as anticipated.  I provided 30 minutes of non-face-to-face time during this encounter.   Huston Foley, MD

## 2022-12-24 ENCOUNTER — Other Ambulatory Visit: Payer: Self-pay | Admitting: Family Medicine

## 2022-12-25 ENCOUNTER — Encounter: Payer: Self-pay | Admitting: Family Medicine

## 2022-12-26 NOTE — Telephone Encounter (Signed)
I appreciate the update.  I am glad the gabapentin is working well.  We can continue with the current dose for now.  I sent in her cholestyramine yesterday - can we make sure she has what she needs?  Katina Degree. Jimmey Ralph, MD 12/26/2022 1:41 PM

## 2022-12-26 NOTE — Telephone Encounter (Signed)
See note

## 2023-01-09 ENCOUNTER — Other Ambulatory Visit: Payer: Self-pay | Admitting: Family Medicine

## 2023-01-09 ENCOUNTER — Other Ambulatory Visit: Payer: Self-pay | Admitting: Neurology

## 2023-01-14 ENCOUNTER — Other Ambulatory Visit: Payer: Self-pay | Admitting: *Deleted

## 2023-02-02 DIAGNOSIS — G4733 Obstructive sleep apnea (adult) (pediatric): Secondary | ICD-10-CM | POA: Diagnosis not present

## 2023-02-03 ENCOUNTER — Encounter: Payer: Self-pay | Admitting: Family Medicine

## 2023-02-03 MED ORDER — GABAPENTIN 100 MG PO CAPS: 100.0000 mg | ORAL_CAPSULE | Freq: Two times a day (BID) | ORAL | 2 refills | Status: DC

## 2023-03-08 ENCOUNTER — Other Ambulatory Visit: Payer: Self-pay | Admitting: Family Medicine

## 2023-04-29 ENCOUNTER — Other Ambulatory Visit: Payer: Self-pay | Admitting: Family Medicine

## 2023-05-23 ENCOUNTER — Other Ambulatory Visit: Payer: Self-pay | Admitting: Family Medicine

## 2023-06-26 ENCOUNTER — Ambulatory Visit (INDEPENDENT_AMBULATORY_CARE_PROVIDER_SITE_OTHER): Admitting: Family Medicine

## 2023-06-26 ENCOUNTER — Encounter: Payer: Self-pay | Admitting: Family Medicine

## 2023-06-26 VITALS — BP 121/81 | HR 54 | Temp 98.2°F | Ht 62.0 in | Wt 134.6 lb

## 2023-06-26 DIAGNOSIS — E782 Mixed hyperlipidemia: Secondary | ICD-10-CM

## 2023-06-26 DIAGNOSIS — J4520 Mild intermittent asthma, uncomplicated: Secondary | ICD-10-CM | POA: Diagnosis not present

## 2023-06-26 MED ORDER — PREDNISONE 50 MG PO TABS: ORAL_TABLET | ORAL | 0 refills | Status: DC

## 2023-06-26 MED ORDER — AMOXICILLIN-POT CLAVULANATE 875-125 MG PO TABS: 1.0000 | ORAL_TABLET | Freq: Two times a day (BID) | ORAL | 0 refills | Status: DC

## 2023-06-26 NOTE — Progress Notes (Signed)
   Dominique [name] is a 63 y.o. female who presents today for an office visit.  Assessment/Plan:  New/Acute Problems: Sinusitis/conjunctivitis No red flags.  Given that her dental x-ray showed developing sinusitis a few weeks ago would be reasonable for us  to start antibiotics at this point.  Will start Augmentin.  Her conjunctivitis is likely viral or allergic in nature however Augmentin will cover any bacterial etiologies as well.  She can continue over-the-counter meds as needed.  Encourage hydration.  She will let us  know if not improving in the next several days.  Chronic Problems Addressed Today: Mild intermittent asthma Reassuring exam today.  She uses albuterol  as needed.  Will send pocket prescription for prednisone  and with instruction to not start unless she develops any symptoms concerning for asthma flare.  She has done well with prednisone  previously.  Mixed hyperlipidemia Advised her to come back soon for CPE.  We can check lipids at that time.     Subjective:  HPI:  Patient here with cough and congestion. Symptoms started about a week ago. Tried using OTC medications which has not helped. Also tried loratadine. Nyquil has helped some. Somewhat better the last couple of days. She has had some headache as well. She did have a dental xray a few weeks ago which showed potentially developing sinusitis.  She has not had any shortness of breath though does note that her lungs have felt tight.  No reported fevers or chills.  She has noted redness in the eyes the last couple days.       Objective:  Physical Exam: BP 121/81   Pulse (!) 54   Temp 98.2 F (36.8 C) (Temporal)   Ht 5\' 2"  (1.575 m)   Wt 134 lb 9.6 oz (61.1 kg)   SpO2 97%   BMI 24.62 kg/m   Gen: No acute distress, resting comfortably HEENT: TMs with clear effusion bilaterally.  OP erythematous.  Nasal mucosa erythematous and boggy bilaterally with clear discharge.  Conjunctival erythema noted bilaterally.   Extraocular eye movements intact without pain. CV: Regular rate and rhythm with no murmurs appreciated Pulm: Normal work of breathing, clear to auscultation bilaterally with no crackles, wheezes, or rhonchi Neuro: Grossly normal, moves all extremities Psych: Normal affect and thought content      Jeneane Pieczynski M. Daneil Dunker, MD 06/26/2023 11:36 AM

## 2023-06-26 NOTE — Patient Instructions (Addendum)
 It was very nice to see you today!  Please start the Augmentin.  I will also send in prednisone .  Let us  know if not improving.  Please come back soon for your physical.Return if symptoms worsen or fail to improve, for Annual Physical.   Take care, Dr Daneil Dunker  PLEASE NOTE:  If you had any lab tests, please let us  know if you have not heard back within a few days. You may see your results on mychart before we have a chance to review them but we will give you a call once they are reviewed by us .   If we ordered any referrals today, please let us  know if you have not heard from their office within the next week.   If you had any urgent prescriptions sent in today, please check with the pharmacy within an hour of our visit to make sure the prescription was transmitted appropriately.   Please try these tips to maintain a healthy lifestyle:  Eat at least 3 REAL meals and 1-2 snacks per day.  Aim for no more than 5 hours between eating.  If you eat breakfast, please do so within one hour of getting up.   Each meal should contain half fruits/vegetables, one quarter protein, and one quarter carbs (no bigger than a computer mouse)  Cut down on sweet beverages. This includes juice, soda, and sweet tea.   Drink at least 1 glass of water with each meal and aim for at least 8 glasses per day  Exercise at least 150 minutes every week.

## 2023-06-26 NOTE — Assessment & Plan Note (Signed)
 Reassuring exam today.  She uses albuterol  as needed.  Will send pocket prescription for prednisone  and with instruction to not start unless she develops any symptoms concerning for asthma flare.  She has done well with prednisone  previously.

## 2023-06-26 NOTE — Assessment & Plan Note (Signed)
 Advised her to come back soon for CPE.  We can check lipids at that time.

## 2023-07-03 ENCOUNTER — Other Ambulatory Visit: Payer: Self-pay | Admitting: Family Medicine

## 2023-07-12 ENCOUNTER — Other Ambulatory Visit: Payer: Self-pay | Admitting: Neurology

## 2023-07-20 IMAGING — DX DG KNEE AP/LAT W/ SUNRISE*R*
3 series · 3 of 3 positions shown · non-contrast
Comparison: None.

CLINICAL DATA: Right knee pain

EXAM:
RIGHT KNEE 3 VIEWS

[knee ap]
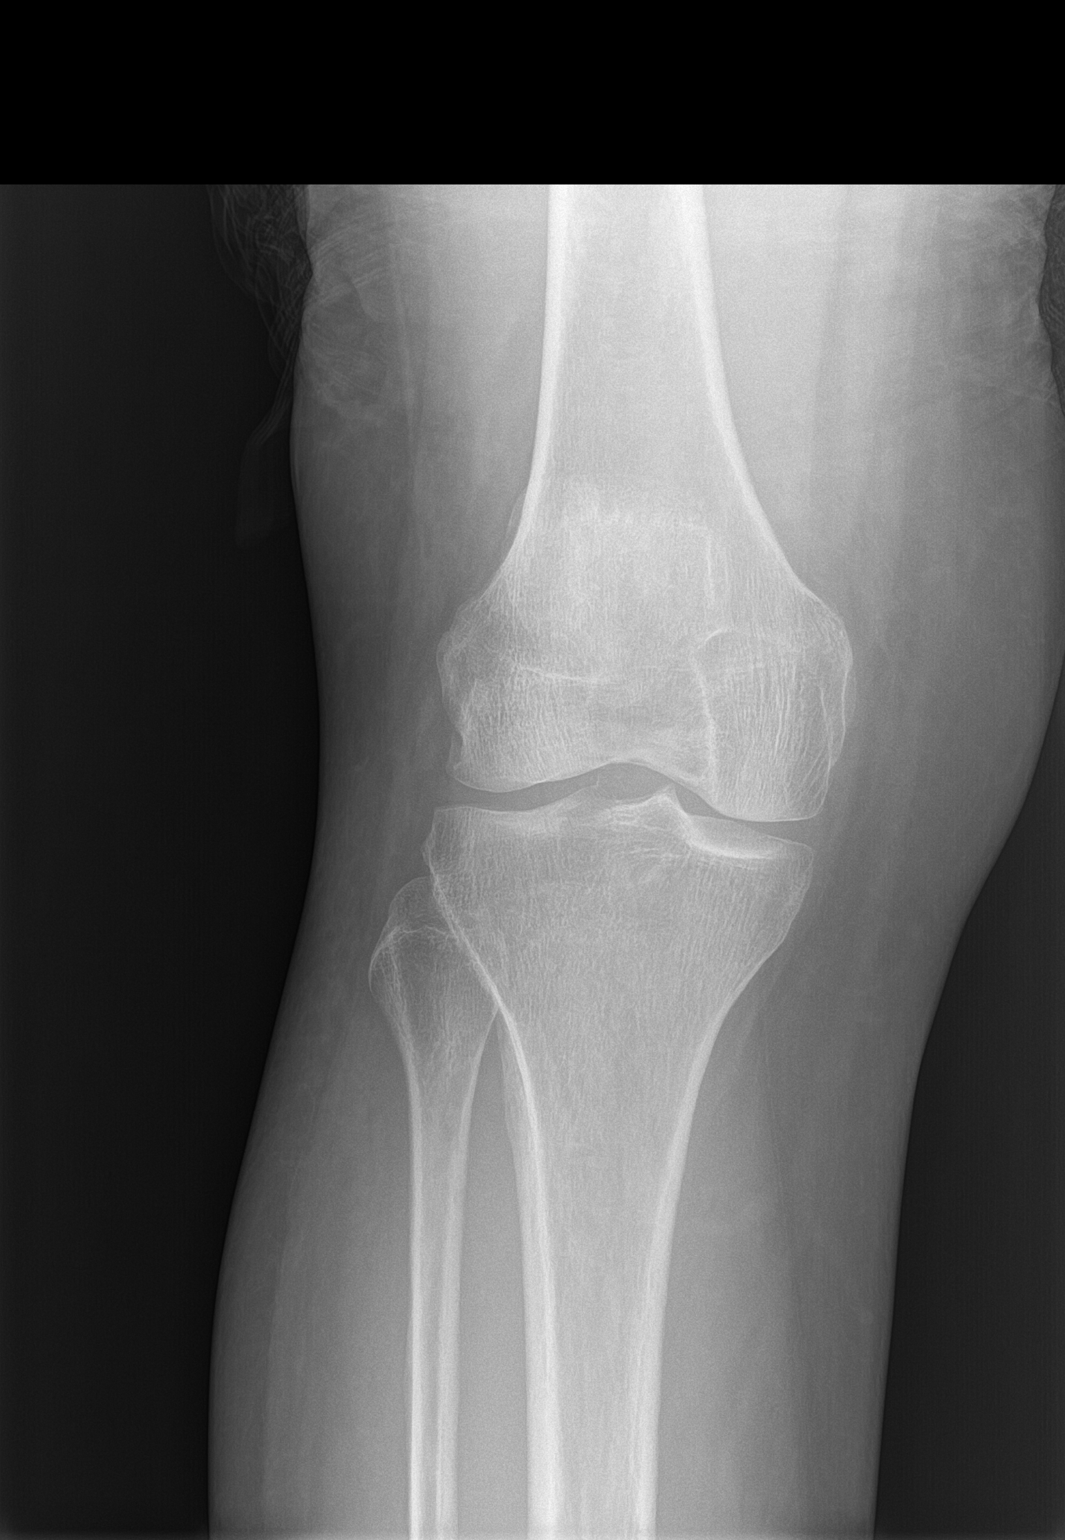

[knee lat]
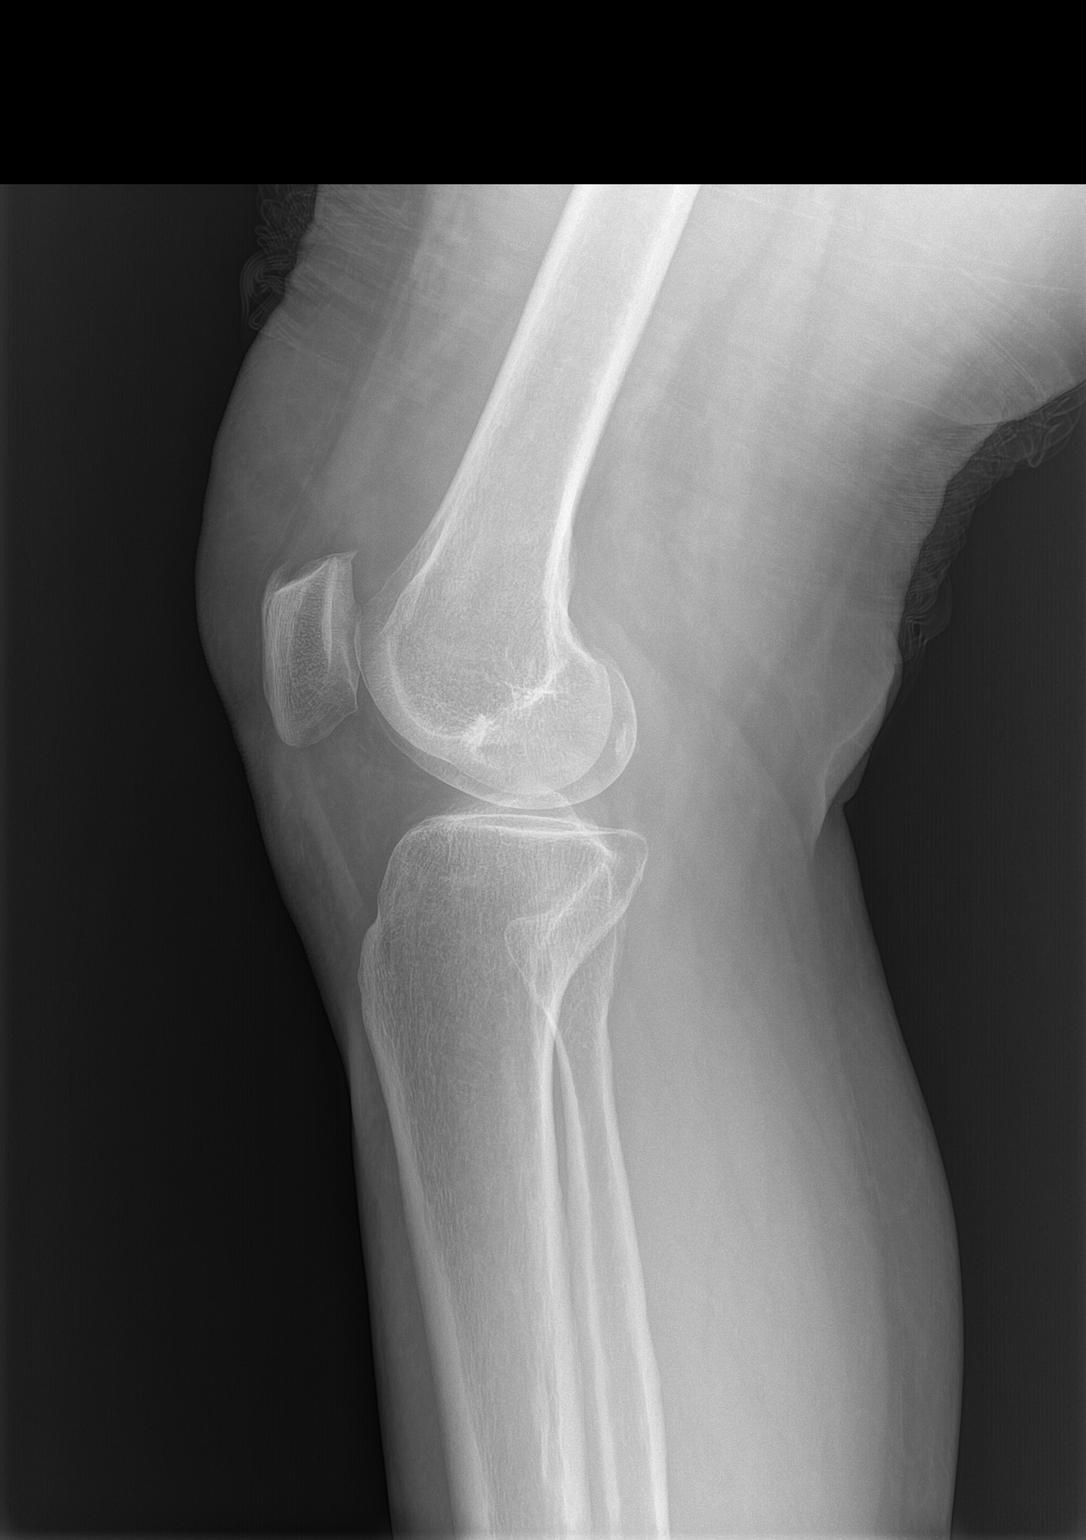

[patella]
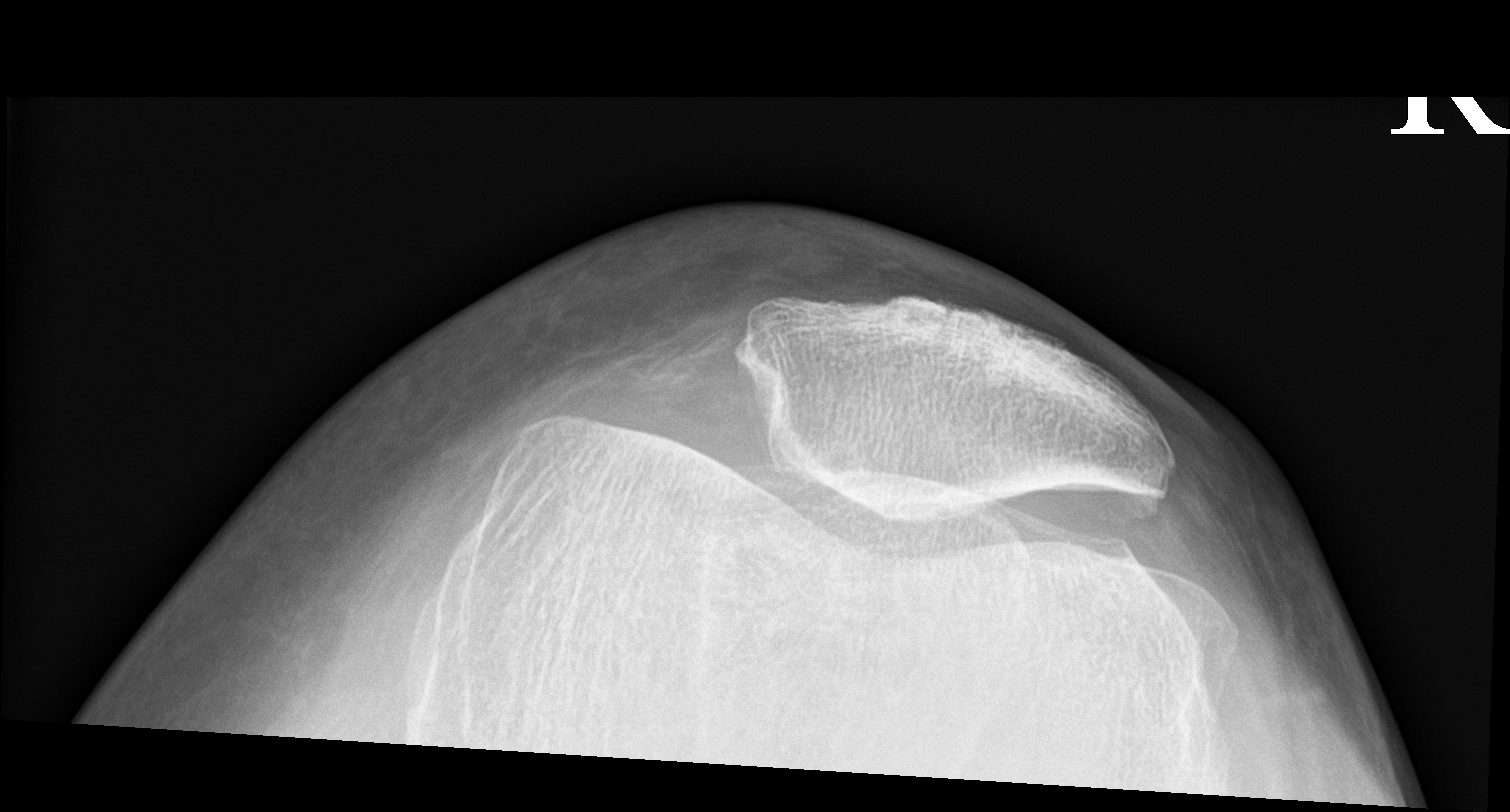

[3 of 3 positions shown; findings below may reference images not displayed]

FINDINGS: No acute fracture or dislocation. Minimal patellar osteophyte
formation. Joint spaces are well maintained. Moderate right knee
joint effusion.
IMPRESSION: No acute osseous abnormality.

Moderate right knee joint effusion.

## 2023-07-31 ENCOUNTER — Other Ambulatory Visit: Payer: Self-pay | Admitting: Family Medicine

## 2023-08-12 ENCOUNTER — Other Ambulatory Visit: Payer: Self-pay | Admitting: Family Medicine

## 2023-08-12 DIAGNOSIS — Z1231 Encounter for screening mammogram for malignant neoplasm of breast: Secondary | ICD-10-CM

## 2023-08-17 ENCOUNTER — Ambulatory Visit
Admission: RE | Admit: 2023-08-17 | Discharge: 2023-08-17 | Disposition: A | Discharge status: Home / Self Care | Source: Ambulatory Visit | Attending: Family Medicine | Admitting: Family Medicine

## 2023-08-17 DIAGNOSIS — Z1231 Encounter for screening mammogram for malignant neoplasm of breast: Secondary | ICD-10-CM

## 2023-08-25 ENCOUNTER — Ambulatory Visit: Admitting: Family Medicine

## 2023-08-25 ENCOUNTER — Encounter: Payer: Self-pay | Admitting: Family Medicine

## 2023-08-25 ENCOUNTER — Other Ambulatory Visit (HOSPITAL_COMMUNITY)
Admission: RE | Admit: 2023-08-25 | Discharge: 2023-08-25 | Disposition: A | Discharge status: Home / Self Care | Source: Ambulatory Visit | Attending: Family Medicine | Admitting: Family Medicine

## 2023-08-25 ENCOUNTER — Other Ambulatory Visit: Payer: Self-pay | Admitting: Family Medicine

## 2023-08-25 VITALS — BP 120/80 | HR 48 | Temp 97.6°F | Ht 62.0 in | Wt 132.6 lb

## 2023-08-25 DIAGNOSIS — J4520 Mild intermittent asthma, uncomplicated: Secondary | ICD-10-CM

## 2023-08-25 DIAGNOSIS — G2581 Restless legs syndrome: Secondary | ICD-10-CM

## 2023-08-25 DIAGNOSIS — K2100 Gastro-esophageal reflux disease with esophagitis, without bleeding: Secondary | ICD-10-CM | POA: Diagnosis not present

## 2023-08-25 DIAGNOSIS — R7989 Other specified abnormal findings of blood chemistry: Secondary | ICD-10-CM | POA: Diagnosis not present

## 2023-08-25 DIAGNOSIS — Z23 Encounter for immunization: Secondary | ICD-10-CM | POA: Diagnosis not present

## 2023-08-25 DIAGNOSIS — Z0001 Encounter for general adult medical examination with abnormal findings: Secondary | ICD-10-CM | POA: Insufficient documentation

## 2023-08-25 DIAGNOSIS — G250 Essential tremor: Secondary | ICD-10-CM

## 2023-08-25 DIAGNOSIS — Z124 Encounter for screening for malignant neoplasm of cervix: Secondary | ICD-10-CM | POA: Diagnosis not present

## 2023-08-25 DIAGNOSIS — F419 Anxiety disorder, unspecified: Secondary | ICD-10-CM

## 2023-08-25 DIAGNOSIS — Z131 Encounter for screening for diabetes mellitus: Secondary | ICD-10-CM | POA: Diagnosis not present

## 2023-08-25 DIAGNOSIS — E038 Other specified hypothyroidism: Secondary | ICD-10-CM

## 2023-08-25 DIAGNOSIS — F325 Major depressive disorder, single episode, in full remission: Secondary | ICD-10-CM

## 2023-08-25 DIAGNOSIS — R195 Other fecal abnormalities: Secondary | ICD-10-CM

## 2023-08-25 DIAGNOSIS — E782 Mixed hyperlipidemia: Secondary | ICD-10-CM

## 2023-08-25 LAB — LIPID PANEL
Cholesterol: 177 mg/dL (ref 0–200)
HDL: 35.2 mg/dL — ABNORMAL LOW (ref >39.00)
LDL Cholesterol: 85 mg/dL (ref 0–99)
NonHDL: 142.22
Total CHOL/HDL Ratio: 5
Triglycerides: 288 mg/dL — ABNORMAL HIGH (ref 0.0–149.0)
VLDL: 57.6 mg/dL — ABNORMAL HIGH (ref 0.0–40.0)

## 2023-08-25 LAB — CBC
HCT: 38.9 % (ref 36.0–46.0)
Hemoglobin: 13.2 g/dL (ref 12.0–15.0)
MCHC: 33.8 g/dL (ref 30.0–36.0)
MCV: 94.5 fl (ref 78.0–100.0)
Platelets: 130 10*3/uL — ABNORMAL LOW (ref 150.0–400.0)
RBC: 4.11 Mil/uL (ref 3.87–5.11)
RDW: 12.8 % (ref 11.5–15.5)
WBC: 6.3 10*3/uL (ref 4.0–10.5)

## 2023-08-25 LAB — COMPREHENSIVE METABOLIC PANEL WITH GFR
ALT: 13 U/L (ref 0–35)
AST: 14 U/L (ref 0–37)
Albumin: 4.3 g/dL (ref 3.5–5.2)
Alkaline Phosphatase: 74 U/L (ref 39–117)
BUN: 13 mg/dL (ref 6–23)
CO2: 27 meq/L (ref 19–32)
Calcium: 9.2 mg/dL (ref 8.4–10.5)
Chloride: 102 meq/L (ref 96–112)
Creatinine, Ser: 0.92 mg/dL (ref 0.40–1.20)
GFR: 66.68 mL/min (ref >60.00)
Glucose, Bld: 77 mg/dL (ref 70–99)
Potassium: 3.8 meq/L (ref 3.5–5.1)
Sodium: 138 meq/L (ref 135–145)
Total Bilirubin: 0.6 mg/dL (ref 0.2–1.2)
Total Protein: 6.3 g/dL (ref 6.0–8.3)

## 2023-08-25 LAB — VITAMIN D 25 HYDROXY (VIT D DEFICIENCY, FRACTURES): VITD: 47.57 ng/mL (ref 30.00–100.00)

## 2023-08-25 LAB — TSH: TSH: 2.34 u[IU]/mL (ref 0.35–5.50)

## 2023-08-25 LAB — HEMOGLOBIN A1C: Hgb A1c MFr Bld: 5.3 % (ref 4.6–6.5)

## 2023-08-25 LAB — VITAMIN B12: Vitamin B-12: 281 pg/mL (ref 211–911)

## 2023-08-25 MED ORDER — CHOLESTYRAMINE 4 G PO PACK: PACK | ORAL | 3 refills | Status: DC

## 2023-08-25 MED ORDER — ESCITALOPRAM OXALATE 20 MG PO TABS: 20.0000 mg | ORAL_TABLET | Freq: Every day | ORAL | 3 refills | Status: AC

## 2023-08-25 MED ORDER — ALBUTEROL SULFATE HFA 108 (90 BASE) MCG/ACT IN AERS: 1.0000 | INHALATION_SPRAY | Freq: Four times a day (QID) | RESPIRATORY_TRACT | 3 refills | Status: AC | PRN

## 2023-08-25 MED ORDER — BUPROPION HCL ER (XL) 300 MG PO TB24: 300.0000 mg | ORAL_TABLET | Freq: Every day | ORAL | 3 refills | Status: AC

## 2023-08-25 MED ORDER — GABAPENTIN 100 MG PO CAPS: 100.0000 mg | ORAL_CAPSULE | Freq: Two times a day (BID) | ORAL | 3 refills | Status: AC

## 2023-08-25 MED ORDER — PROPRANOLOL HCL 60 MG PO TABS: 60.0000 mg | ORAL_TABLET | Freq: Two times a day (BID) | ORAL | 3 refills | Status: AC

## 2023-08-25 MED ORDER — LEVOTHYROXINE SODIUM 25 MCG PO TABS: 25.0000 ug | ORAL_TABLET | Freq: Every day | ORAL | 3 refills | Status: AC

## 2023-08-25 NOTE — Addendum Note (Signed)
 Addended by: KALLIE RAEANNE DEL on: 08/25/2023 11:57 AM   Modules accepted: Orders

## 2023-08-25 NOTE — Assessment & Plan Note (Signed)
 Overall symptoms are stable on current regimen of propranolol  60 mg twice daily and gabapentin  100 mg twice daily.

## 2023-08-25 NOTE — Assessment & Plan Note (Signed)
 Stable on cholestyramine  daily.  Will refill today.

## 2023-08-25 NOTE — Assessment & Plan Note (Signed)
 Stable on Wellbutrin  300 mg daily and Lexapro  20 mg daily.  Will refill today.

## 2023-08-25 NOTE — Assessment & Plan Note (Signed)
 Reassuring exam today.  No recent flares.  Use albuterol  as needed.

## 2023-08-25 NOTE — Assessment & Plan Note (Signed)
 Stable on Lexapro 20 mg daily.

## 2023-08-25 NOTE — Progress Notes (Signed)
 Chief Complaint:  Dominique [name] is a 63 y.o. female who presents today for her annual comprehensive physical exam.    Assessment/Plan:  Chronic Problems Addressed Today: Mixed hyperlipidemia Check lipids.  Discussed lifestyle modifications.  Subclinical hypothyroidism Check TSH.  She is on Synthroid  25 mcg daily.  Tolerating well.  GERD (gastroesophageal reflux disease) Overall doing well on Prilosec 5 times weekly as needed.  We did discuss long-term complications of PPI use however benefits outweigh the risks of untreated GERD.  She will continue current regimen.  Mild intermittent asthma Reassuring exam today.  No recent flares.  Use albuterol  as needed.  Low vitamin D  level Check vitamin D .  Depression, major, single episode, complete remission (HCC) Stable on Wellbutrin  300 mg daily and Lexapro  20 mg daily.  Will refill today.  Loose stools Stable on cholestyramine  daily.  Will refill today.  Benign essential tremor Overall symptoms are stable on current regimen of propranolol  60 mg twice daily and gabapentin  100 mg twice daily.  Anxiety Stable on Lexapro  20 mg daily.  Restless legs syndrome Continue management per neurology.   Preventative Healthcare: Check labs.  Pneumonia vaccine given today.  Pap performed today.  Patient Counseling(The following topics were reviewed and/or handout was given):  -Nutrition: Stressed importance of moderation in sodium/caffeine intake, saturated fat and cholesterol, caloric balance, sufficient intake of fresh fruits, vegetables, and fiber.  -Stressed the importance of regular exercise.   -Substance Abuse: Discussed cessation/primary prevention of tobacco, alcohol, or other drug use; driving or other dangerous activities under the influence; availability of treatment for abuse.   -Injury prevention: Discussed safety belts, safety helmets, smoke detector, smoking near bedding or upholstery.   -Sexuality: Discussed sexually  transmitted diseases, partner selection, use of condoms, avoidance of unintended pregnancy and contraceptive alternatives.   -Dental health: Discussed importance of regular tooth brushing, flossing, and dental visits.  -Health maintenance and immunizations reviewed. Please refer to Health maintenance section.  Return to care in 1 year for next preventative visit.     Subjective:  HPI:  She has no acute complaints today. See Assessment / plan for status of chronic conditions. She is doing well today. No acute concerns.   Lifestyle Diet: Balanced. Plenty of fruits and vegetables.  Exercise: Spending a lot of time outdoors.      06/26/2023   11:07 AM  Depression screen PHQ 2/9  Decreased Interest 0  Down, Depressed, Hopeless 0  PHQ - 2 Score 0    Health Maintenance Due  Topic Date Due   Hepatitis C Screening  Never done   Cervical Cancer Screening (HPV/Pap Cotest)  09/09/2020     ROS: Per HPI, otherwise a complete review of systems was negative.   PMH:  The following were reviewed and entered/updated in epic: Past Medical History:  Diagnosis Date   Anemia    on meds   Anxiety    on meds   Asthma    uses inhaler as needed   Blood transfusion without reported diagnosis 1992   post d/c   Cervix prolapsed into vagina    Uro-Gyne Exam 2011, 2014   Depression    on meds   GERD (gastroesophageal reflux disease)    on meds   Hearing loss    High cholesterol    diet controlled-no meds at this time (04/17/2020)   Migraines    hx of   Neuromuscular disorder (HCC)    tremors   Restless leg syndrome    taking iron  to support   Sleep apnea    uses CPAP   Thyroid  disease    on meds   Tremor    Patient Active Problem List   Diagnosis Date Noted   Herpes zoster with complication 05/15/2021   Loose stools 04/17/2020   Arthritis 01/28/2019   Asthma, moderate 01/28/2019   Depression, major, single episode, complete remission (HCC) 12/14/2018   Subclinical  hypothyroidism 08/06/2018   Mild intermittent asthma 08/06/2018   GERD (gastroesophageal reflux disease) 08/06/2018   Low vitamin D  level 08/06/2018   Restless legs syndrome 08/06/2018   OSA on CPAP 08/06/2018   Mixed hyperlipidemia 09/09/2017   Benign essential tremor 09/09/2017   Anxiety 09/09/2017   Hearing loss 07/29/2013   Environmental allergies 04/29/2012   Heartburn 10/21/2011   Uterovaginal prolapse 05/06/2011   Past Surgical History:  Procedure Laterality Date   BREAST BIOPSY Right    CHOLECYSTECTOMY N/A 08/14/2019   Procedure: LAPAROSCOPIC CHOLECYSTECTOMY;  Surgeon: Stevie Herlene Righter, MD;  Location: [facility] OR;  Service: General;  Laterality: N/A;   COLONOSCOPY  2015   with EGD at [facility]   DILATION AND CURETTAGE OF UTERUS  02/24/1990   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     MOUTH SURGERY     3 teeth removed    ROOT CANAL     2 times    VAGINAL DELIVERY     1986   WISDOM TOOTH EXTRACTION      Family History  Problem Relation Age of Onset   Hypertension Mother    High Cholesterol Mother    Cataracts Mother    Retinal detachment Mother    Colon cancer Mother 32   Colon polyps Mother 28   Hypertension Brother    High Cholesterol Brother    Sleep apnea Brother    Heart disease Maternal Uncle    Diabetes Paternal Aunt    Breast cancer Paternal Aunt    Colon polyps Paternal Grandmother    Diabetes Paternal Grandfather    Arthritis Paternal Grandfather    Depression Daughter    Menstrual problems Daughter    Anxiety disorder Daughter    ADD / ADHD Son    Depression Son    Anxiety disorder Son    Breast cancer Cousin    Esophageal cancer Neg Hx    Stomach cancer Neg Hx    Rectal cancer Neg Hx    Tremor Neg Hx    Parkinson's disease Neg Hx     Medications- reviewed and updated Current Outpatient Medications  Medication Sig Dispense Refill   ferrous sulfate 325 (65 FE) MG tablet Take 325 mg by mouth daily.     omeprazole  (PRILOSEC) 20 MG capsule Take 20 mg  by mouth daily.     primidone  (MYSOLINE ) 50 MG tablet Take 2 tablets (100 mg total) by mouth at bedtime. Please call [phone] to schedule follow-up appointment. 180 tablet 1   VITAMIN D  PO Take by mouth.     albuterol  (VENTOLIN  HFA) 108 (90 Base) MCG/ACT inhaler Inhale 1 puff into the lungs every 6 (six) hours as needed for wheezing or shortness of breath. 60 g 3   buPROPion  (WELLBUTRIN  XL) 300 MG 24 hr tablet Take 1 tablet (300 mg total) by mouth daily. 90 tablet 3   cholestyramine  (QUESTRAN ) 4 g packet Mix and drink 1 packet by mouth daily. 180 each 3   escitalopram  (LEXAPRO ) 20 MG tablet Take 1 tablet (20 mg total) by mouth daily. 90 tablet 3  gabapentin  (NEURONTIN ) 100 MG capsule Take 1 capsule (100 mg total) by mouth 2 (two) times daily. 180 capsule 3   levothyroxine  (SYNTHROID ) 25 MCG tablet Take 1 tablet (25 mcg total) by mouth daily before breakfast. 90 tablet 3   propranolol  (INDERAL ) 60 MG tablet Take 1 tablet (60 mg total) by mouth 2 (two) times daily. 180 tablet 3   No current facility-administered medications for this visit.    Allergies-reviewed and updated Allergies  Allergen Reactions   Bactrim [Sulfamethoxazole-Trimethoprim] Rash   Elemental Sulfur Rash   Sulfa Antibiotics Rash    Social History   Socioeconomic History   Marital status: Married    Spouse name: Not on file   Number of children: 3   Years of education: Not on file   Highest education level: Professional school degree (e.g., MD, DDS, DVM, JD)  Occupational History   Occupation: retired    Comment: pediatrician  Tobacco Use   Smoking status: Never   Smokeless tobacco: Never  Vaping Use   Vaping status: Never Used  Substance and Sexual Activity   Alcohol use: Not Currently    Alcohol/week: 2.0 standard drinks of alcohol    Types: 2 Shots of liquor per week    Comment: occ   Drug use: No   Sexual activity: Not Currently  Other Topics Concern   Not on file  Social History Narrative   As  of 11/11/16   Diet: Minimal red meat, lactose intolerance       Caffeine: Yes      Married, if yes what year: Yes, 2006      Do you live in a house, apartment, assisted living, condo, trailer, ect: House, 2 stories, 2 persons      Pets: 1 dog      Current/Past profession: Pediatrician       Exercise: A little, walking dog and elliptical          Living Will: No   DNR: No   POA/HPOA: No      Functional Status:   Do you have difficulty bathing or dressing yourself? No   Do you have difficulty preparing food or eating? No   Do you have difficulty managing your medications? No    Do you have difficulty managing your finances? No    Do you have difficulty affording your medications? No       Right handed   Lives in two story home with husband and dog   Social Drivers of Health   Financial Resource Strain: Low Risk  (08/24/2023)   Overall Financial Resource Strain (CARDIA)    Difficulty of Paying Living Expenses: Not hard at all  Food Insecurity: No Food Insecurity (08/24/2023)   Hunger Vital Sign    Worried About Running Out of Food in the Last Year: Never true    Ran Out of Food in the Last Year: Never true  Transportation Needs: No Transportation Needs (08/24/2023)   PRAPARE - Administrator, Civil Service (Medical): No    Lack of Transportation (Non-Medical): No  Physical Activity: Sufficiently Active (08/24/2023)   Exercise Vital Sign    Days of Exercise per Week: 5 days    Minutes of Exercise per Session: 30 min  Recent Concern: Physical Activity - Insufficiently Active (06/25/2023)   Exercise Vital Sign    Days of Exercise per Week: 2 days    Minutes of Exercise per Session: 60 min  Stress: No Stress Concern Present (08/24/2023)  [employer] of Occupational Health - Occupational Stress Questionnaire    Feeling of Stress: Only a little  Social Connections: Socially Integrated (08/24/2023)   Social Connection and Isolation Panel    Frequency of  Communication with Friends and Family: Twice a week    Frequency of Social Gatherings with Friends and Family: Once a week    Attends Religious Services: More than 4 times per year    Active Member of [employer] or Organizations: Yes    Attends Engineer, structural: More than 4 times per year    Marital Status: Married        Objective:  Physical Exam: BP 120/80   Pulse (!) 48   Temp 97.6 F (36.4 C)   Ht 5' 2 (1.575 m)   Wt 132 lb 9.6 oz (60.1 kg)   SpO2 98%   BMI 24.25 kg/m   Body mass index is 24.25 kg/m. Wt Readings from Last 3 Encounters:  08/25/23 132 lb 9.6 oz (60.1 kg)  06/26/23 134 lb 9.6 oz (61.1 kg)  07/15/22 145 lb 12.8 oz (66.1 kg)   Gen: NAD, resting comfortably HEENT: TMs normal bilaterally. OP clear. No thyromegaly noted.  CV: RRR with no murmurs appreciated Pulm: NWOB, CTAB with no crackles, wheezes, or rhonchi GI: Normal bowel sounds present. Soft, Nontender, Nondistended. MSK: no edema, cyanosis, or clubbing noted GU: Normal female genitalia.  No abnormalities.  Chaperone present for exam. Skin: warm, dry Neuro: CN2-12 grossly intact. Strength 5/5 in upper and lower extremities. Reflexes symmetric and intact bilaterally.  Psych: Normal affect and thought content     Linsie Lupo M. Kennyth, MD 08/25/2023 11:44 AM

## 2023-08-25 NOTE — Assessment & Plan Note (Signed)
 Overall doing well on Prilosec 5 times weekly as needed.  We did discuss long-term complications of PPI use however benefits outweigh the risks of untreated GERD.  She will continue current regimen.

## 2023-08-25 NOTE — Assessment & Plan Note (Signed)
 Check TSH.  She is on Synthroid  25 mcg daily.  Tolerating well.

## 2023-08-25 NOTE — Telephone Encounter (Signed)
 Please see message from pharmacy.

## 2023-08-25 NOTE — Assessment & Plan Note (Signed)
 Check lipids. Discussed lifestyle modifications.

## 2023-08-25 NOTE — Assessment & Plan Note (Signed)
Continue management per neurology. 

## 2023-08-25 NOTE — Patient Instructions (Signed)
 It was very nice to see you today!  We will check blood work today.  I will refill your medications.  We performed your Pap smear today.  If this is normal you will not need any more going forward.  Please continue to work on diet and exercise.  I will see you back in year for your next physical.  Come back sooner if needed.  No follow-ups on file.   Take care, Dr Kennyth  PLEASE NOTE:  If you had any lab tests, please let us  know if you have not heard back within a few days. You may see your results on mychart before we have a chance to review them but we will give you a call once they are reviewed by us .   If we ordered any referrals today, please let us  know if you have not heard from their office within the next week.   If you had any urgent prescriptions sent in today, please check with the pharmacy within an hour of our visit to make sure the prescription was transmitted appropriately.   Please try these tips to maintain a healthy lifestyle:  Eat at least 3 REAL meals and 1-2 snacks per day.  Aim for no more than 5 hours between eating.  If you eat breakfast, please do so within one hour of getting up.   Each meal should contain half fruits/vegetables, one quarter protein, and one quarter carbs (no bigger than a computer mouse)  Cut down on sweet beverages. This includes juice, soda, and sweet tea.   Drink at least 1 glass of water with each meal and aim for at least 8 glasses per day  Exercise at least 150 minutes every week.    Preventive Care 53-24 Years Old, Female Preventive care refers to lifestyle choices and visits with your health care provider that can promote health and wellness. Preventive care visits are also called wellness exams. What can I expect for my preventive care visit? Counseling Your health care provider may ask you questions about your: Medical history, including: Past medical problems. Family medical history. Pregnancy history. Current  health, including: Menstrual cycle. Method of birth control. Emotional well-being. Home life and relationship well-being. Sexual activity and sexual health. Lifestyle, including: Alcohol, nicotine or tobacco, and drug use. Access to firearms. Diet, exercise, and sleep habits. Work and work Astronomer. Sunscreen use. Safety issues such as seatbelt and bike helmet use. Physical exam Your health care provider will check your: Height and weight. These may be used to calculate your BMI (body mass index). BMI is a measurement that tells if you are at a healthy weight. Waist circumference. This measures the distance around your waistline. This measurement also tells if you are at a healthy weight and may help predict your risk of certain diseases, such as type 2 diabetes and high blood pressure. Heart rate and blood pressure. Body temperature. Skin for abnormal spots. What immunizations do I need?  Vaccines are usually given at various ages, according to a schedule. Your health care provider will recommend vaccines for you based on your age, medical history, and lifestyle or other factors, such as travel or where you work. What tests do I need? Screening Your health care provider may recommend screening tests for certain conditions. This may include: Lipid and cholesterol levels. Diabetes screening. This is done by checking your blood sugar (glucose) after you have not eaten for a while (fasting). Pelvic exam and Pap test. Hepatitis B test. Hepatitis C test.  HIV (human immunodeficiency virus) test. STI (sexually transmitted infection) testing, if you are at risk. Lung cancer screening. Colorectal cancer screening. Mammogram. Talk with your health care provider about when you should start having regular mammograms. This may depend on whether you have a family history of breast cancer. BRCA-related cancer screening. This may be done if you have a family history of breast, ovarian, tubal,  or peritoneal cancers. Bone density scan. This is done to screen for osteoporosis. Talk with your health care provider about your test results, treatment options, and if necessary, the need for more tests. Follow these instructions at home: Eating and drinking  Eat a diet that includes fresh fruits and vegetables, whole grains, lean protein, and low-fat dairy products. Take vitamin and mineral supplements as recommended by your health care provider. Do not drink alcohol if: Your health care provider tells you not to drink. You are pregnant, may be pregnant, or are planning to become pregnant. If you drink alcohol: Limit how much you have to 0-1 drink a day. Know how much alcohol is in your drink. In the [place] one drink equals one 12 oz bottle of beer (355 mL), one 5 oz glass of wine (148 mL), or one 1 oz glass of hard liquor (44 mL). Lifestyle Brush your teeth every morning and night with fluoride toothpaste. Floss one time each day. Exercise for at least 30 minutes 5 or more days each week. Do not use any products that contain nicotine or tobacco. These products include cigarettes, chewing tobacco, and vaping devices, such as e-cigarettes. If you need help quitting, ask your health care provider. Do not use drugs. If you are sexually active, practice safe sex. Use a condom or other form of protection to prevent STIs. If you do not wish to become pregnant, use a form of birth control. If you plan to become pregnant, see your health care provider for a prepregnancy visit. Take aspirin only as told by your health care provider. Make sure that you understand how much to take and what form to take. Work with your health care provider to find out whether it is safe and beneficial for you to take aspirin daily. Find healthy ways to manage stress, such as: Meditation, yoga, or listening to music. Journaling. Talking to a trusted person. Spending time with friends and family. Minimize exposure to  UV radiation to reduce your risk of skin cancer. Safety Always wear your seat belt while driving or riding in a vehicle. Do not drive: If you have been drinking alcohol. Do not ride with someone who has been drinking. When you are tired or distracted. While texting. If you have been using any mind-altering substances or drugs. Wear a helmet and other protective equipment during sports activities. If you have firearms in your house, make sure you follow all gun safety procedures. Seek help if you have been physically or sexually abused. What's next? Visit your health care provider once a year for an annual wellness visit. Ask your health care provider how often you should have your eyes and teeth checked. Stay up to date on all vaccines. This information is not intended to replace advice given to you by your health care provider. Make sure you discuss any questions you have with your health care provider. Document Revised: 08/08/2020 Document Reviewed: 08/08/2020 Elsevier Patient Education  2024 [employer].

## 2023-08-25 NOTE — Assessment & Plan Note (Signed)
 Check vitamin D.

## 2023-08-26 ENCOUNTER — Other Ambulatory Visit: Payer: Self-pay | Admitting: Family Medicine

## 2023-08-26 NOTE — Telephone Encounter (Signed)
 Please let patient know. She may have already been paying out of pocket. I am not aware of any other alternatives.

## 2023-08-27 ENCOUNTER — Ambulatory Visit: Payer: Self-pay | Admitting: Family Medicine

## 2023-08-27 LAB — CYTOLOGY - PAP
Comment: NEGATIVE
Diagnosis: NEGATIVE
High risk HPV: NEGATIVE

## 2023-08-27 NOTE — Progress Notes (Signed)
 Her cholesterol is better than last year.  A1c is at goal.  She should keep up the great work with diet and exercise and we can recheck these next year.  Her B12 is still on the low range of normal.  Recommend she continue with B12 supplementation 1000 mcg daily and we should recheck in 3 to 6 months.  Her Pap was normal.  She does not need any repeat Pap smears going forward due to aging out.  All of her other labs are stable and we can recheck again next year.

## 2023-09-01 DIAGNOSIS — G4733 Obstructive sleep apnea (adult) (pediatric): Secondary | ICD-10-CM | POA: Diagnosis not present

## 2023-09-01 NOTE — Telephone Encounter (Signed)
 No clinical concern for her mildly elevated triglycerides. We can recheck in a year but she should continue to work on high fiber diet with plenty of fruits and vegetables.

## 2023-09-10 ENCOUNTER — Telehealth: Payer: Self-pay | Admitting: *Deleted

## 2023-09-10 ENCOUNTER — Ambulatory Visit (INDEPENDENT_AMBULATORY_CARE_PROVIDER_SITE_OTHER): Admitting: Family Medicine

## 2023-09-10 ENCOUNTER — Encounter: Payer: Self-pay | Admitting: Family Medicine

## 2023-09-10 VITALS — BP 103/65 | HR 54 | Temp 96.4°F | Ht 62.0 in | Wt 133.8 lb

## 2023-09-10 DIAGNOSIS — L989 Disorder of the skin and subcutaneous tissue, unspecified: Secondary | ICD-10-CM | POA: Diagnosis not present

## 2023-09-10 NOTE — Patient Instructions (Signed)
 It was very nice to see you today!  I think your mole is likely benign. We can recheck again in 3 months. Let us  know if you have any change in symptoms.   Return in about 3 months (around 12/11/2023).   Take care, Dr Kennyth  PLEASE NOTE:  If you had any lab tests, please let us  know if you have not heard back within a few days. You may see your results on mychart before we have a chance to review them but we will give you a call once they are reviewed by us .   If we ordered any referrals today, please let us  know if you have not heard from their office within the next week.   If you had any urgent prescriptions sent in today, please check with the pharmacy within an hour of our visit to make sure the prescription was transmitted appropriately.   Please try these tips to maintain a healthy lifestyle:  Eat at least 3 REAL meals and 1-2 snacks per day.  Aim for no more than 5 hours between eating.  If you eat breakfast, please do so within one hour of getting up.   Each meal should contain half fruits/vegetables, one quarter protein, and one quarter carbs (no bigger than a computer mouse)  Cut down on sweet beverages. This includes juice, soda, and sweet tea.   Drink at least 1 glass of water with each meal and aim for at least 8 glasses per day  Exercise at least 150 minutes every week.

## 2023-09-10 NOTE — Progress Notes (Signed)
   Dominique [name] is a 63 y.o. female who presents today for an office visit.  Assessment/Plan:  Skin Lesion  Most likely benign nevus.  We did discuss referral to dermatology however would be reasonable to continue with watchful waiting for now.  We discussed reasons to return to care.  She will monitor at home and let us  know if she notices any change in size, shape, color or other symptoms.  We will recheck in 3 months. If she develops any concerning signs or symptoms would consider biopsy versus referral to dermatology at that time.    Subjective:  HPI:  Patient here today with concerning skin lesion.  I did see her about 2 weeks ago for her annual physical.  Labs at that time were notable for low normal B12 and mildly elevated triglycerides.  Her primary concern today is atypical mole.  This was found by her hairdresser a few days ago on top of her scalp.  She has not noticed any symptoms in the area.  No pain or itching.  No bleeding. She is not sure how long it has been present.        Objective:  Physical Exam: BP 103/65   Pulse (!) 54   Temp (!) 96.4 F (35.8 C) (Temporal)   Ht 5' 2 (1.575 m)   Wt 133 lb 12.8 oz (60.7 kg)   SpO2 96%   BMI 24.47 kg/m   Gen: No acute distress, resting comfortably Skin: Even colored 7 x 5 mm hyperpigmented lesion on apex of scalp.  No crusting or bleeding.  See below picture. Neuro: Grossly normal, moves all extremities Psych: Normal affect and thought content        Keyaan Lederman M. Kennyth, MD 09/10/2023 9:37 AM

## 2023-09-10 NOTE — Telephone Encounter (Signed)
 LVM to call back to schedule a F/U visit with Dr Kennyth in 3 month, around 12/11/2023 Please call patient

## 2023-09-11 NOTE — Telephone Encounter (Signed)
 Appt scheduled

## 2023-10-01 ENCOUNTER — Encounter: Payer: Self-pay | Admitting: Family Medicine

## 2023-10-01 DIAGNOSIS — H903 Sensorineural hearing loss, bilateral: Secondary | ICD-10-CM | POA: Diagnosis not present

## 2023-11-26 ENCOUNTER — Encounter: Payer: Self-pay | Admitting: Family Medicine

## 2023-11-26 NOTE — Telephone Encounter (Signed)
 See note

## 2023-11-27 NOTE — Telephone Encounter (Signed)
 I appreciate the update.  Okay to send in new prescription for 1.5 packages daily.

## 2023-11-30 ENCOUNTER — Other Ambulatory Visit: Payer: Self-pay | Admitting: *Deleted

## 2023-11-30 MED ORDER — CHOLESTYRAMINE 4 G PO PACK: PACK | ORAL | 3 refills | Status: DC

## 2023-12-04 ENCOUNTER — Other Ambulatory Visit: Payer: Self-pay | Admitting: *Deleted

## 2023-12-04 NOTE — Telephone Encounter (Signed)
**Note De-identified  [place] Obfuscation** Please advise 

## 2023-12-05 ENCOUNTER — Other Ambulatory Visit: Payer: Self-pay | Admitting: Family Medicine

## 2023-12-07 ENCOUNTER — Other Ambulatory Visit: Payer: Self-pay | Admitting: *Deleted

## 2023-12-07 MED ORDER — CHOLESTYRAMINE 4 G PO PACK: PACK | ORAL | 3 refills | Status: AC

## 2023-12-07 NOTE — Telephone Encounter (Signed)
 Ok to send in new prescription for 1 packet twice per day.  Worth HERO. Kennyth, MD 12/07/2023 10:34 AM

## 2023-12-11 ENCOUNTER — Ambulatory Visit (INDEPENDENT_AMBULATORY_CARE_PROVIDER_SITE_OTHER): Admitting: Family Medicine

## 2023-12-11 ENCOUNTER — Encounter: Payer: Self-pay | Admitting: Family Medicine

## 2023-12-11 VITALS — BP 104/78 | HR 53 | Temp 97.2°F | Ht 62.0 in | Wt 136.2 lb

## 2023-12-11 DIAGNOSIS — R195 Other fecal abnormalities: Secondary | ICD-10-CM

## 2023-12-11 DIAGNOSIS — F325 Major depressive disorder, single episode, in full remission: Secondary | ICD-10-CM

## 2023-12-11 DIAGNOSIS — L819 Disorder of pigmentation, unspecified: Secondary | ICD-10-CM

## 2023-12-11 NOTE — Progress Notes (Signed)
   Dominique [name] is a 63 y.o. female who presents today for an office visit.  Assessment/Plan:  New/Acute Problems: Pigmented Lesion Lesion on posterior scalp has not changed significantly over the last 3 months however Punch biopsy was performed today to further evaluate.  See below procedure note.  She tolerated well.  We discussed care after.  Chronic Problems Addressed Today: Depression, major, single episode, complete remission Patient has been under more stress recently due to a few social situations.  Symptoms are currently manageable.  She is on Lexapro  20 mg daily and Wellbutrin  3 mg daily.  Discussed medication changes versus referral to see therapist however she declined for today.  She will let us  know if she changes her mind or needs any further assistance.   Loose stools She recently increase cholestyramine  to 2 packets daily.  This does seem to have helped with symptoms though she is having issues with getting medication from the pharmacy.  She will let us  know if she needs any further assistance after her next refill.     Subjective:  HPI:  See assessment / plan for status of chronic conditions.   Discussed the use of AI scribe software for clinical note transcription with the patient, who gave verbal consent to proceed.  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  The lesion is located on the apex of her scalp and was previously measured at approximately seven millimeters. A photograph was taken at the last visit for comparison.  She mentions having fallen more than two times in the last year, which she notes is not different from her experience fifteen years ago. She does not express concern about these falls.  She is currently managing stress related to a friend staying with her, which has created tension in her household. The friend is an alcoholic, and his decision-making skills are poor,  leading to an uncertain living arrangement.  She is using cholestyramine , but there is some uncertainty about her current supply due to issues with insurance and online information. She is awaiting her next refill to confirm the status.         Objective:  Physical Exam: BP 104/78   Pulse (!) 53   Temp (!) 97.2 F (36.2 C) (Temporal)   Ht 5' 2 (1.575 m)   Wt 136 lb 3.2 oz (61.8 kg)   SpO2 95%   BMI 24.91 kg/m   Gen: No acute distress, resting comfortably CV: Regular rate and rhythm with no murmurs appreciated Pulm: Normal work of breathing, clear to auscultation bilaterally with no crackles, wheezes, or rhonchi Skin: 7 x 5 hyperpigmented lesion on apex of scalp similar in appearance to 3 months ago.  See below picture Neuro: Grossly normal, moves all extremities Psych: Normal affect and thought content    Procedure Note After informed written consent was obtained, using Betadine for cleansing and 1% Lidocaine  with epinephrine for anesthetic, with sterile technique a 3 mm punch biopsy was used to obtain a biopsy specimen of the lesion. Hemostasis was obtained by pressure and wound was not sutured. Antibiotic dressing is applied, and wound care instructions provided. Be alert for any signs of cutaneous infection. The specimen is labeled and sent to pathology for evaluation. The procedure was well tolerated without complications.       Worth HERO. Kennyth, MD 12/11/2023 9:36 AM

## 2023-12-11 NOTE — Assessment & Plan Note (Signed)
 Patient has been under more stress recently due to a few social situations.  Symptoms are currently manageable.  She is on Lexapro  20 mg daily and Wellbutrin  3 mg daily.  Discussed medication changes versus referral to see therapist however she declined for today.  She will let us  know if she changes her mind or needs any further assistance.

## 2023-12-11 NOTE — Patient Instructions (Addendum)
 It was very nice to see you today!  VISIT SUMMARY: Today, we followed up on your scalp lesion and performed a punch biopsy to get a definitive diagnosis. We also discussed your recent falls and stress related to your current living situation.  YOUR PLAN: SKIN LESION OF SCALP: The lesion on your scalp is 7 mm, hyperpigmented, and has been stable for 3 months with no concerning features. -We performed a punch biopsy today to get a definitive diagnosis. -Keep the biopsy site dry for 24-48 hours. -Apply a Band-Aid to control bleeding post-biopsy.  STRESS MANAGEMENT: You are experiencing stress due to a friend staying with you, which has created tension in your household. -Consider setting boundaries and seeking support to manage the situation.   Return if symptoms worsen or fail to improve.   Take care, Dr Kennyth  PLEASE NOTE:  If you had any lab tests, please let us  know if you have not heard back within a few days. You may see your results on mychart before we have a chance to review them but we will give you a call once they are reviewed by us .   If we ordered any referrals today, please let us  know if you have not heard from their office within the next week.   If you had any urgent prescriptions sent in today, please check with the pharmacy within an hour of our visit to make sure the prescription was transmitted appropriately.   Please try these tips to maintain a healthy lifestyle:  Eat at least 3 REAL meals and 1-2 snacks per day.  Aim for no more than 5 hours between eating.  If you eat breakfast, please do so within one hour of getting up.   Each meal should contain half fruits/vegetables, one quarter protein, and one quarter carbs (no bigger than a computer mouse)  Cut down on sweet beverages. This includes juice, soda, and sweet tea.   Drink at least 1 glass of water with each meal and aim for at least 8 glasses per day  Exercise at least 150 minutes every week.      Follow-up removing a Small Tissue of Skin for Testing (Skin Biopsy): What to Know After After a small tissue of your skin is removed for testing, you may have soreness, mild pain, bruising, or itching in the areas that were cut. You may also have some redness and swelling. Follow these instructions at home: Caring for your cut from surgery  Take care of your cut as told. Make sure you: Wash your hands with soap and water for at least 20 seconds before and after you change your bandage. If you can't use soap and water, use hand sanitizer. Change your bandage. Leave stitches or skin glue alone. Leave tape strips alone unless you're told to take them off. You may trim the edges of the tape strips if they curl up. Check your cut every day for signs of infection. Check for: Redness, swelling, or pain. Fluid or blood. Warmth. Pus or a bad smell. General instructions Take your medicines only as told. Avoid heavy exercises and activities until the stitches are removed or the area heals. Avoid exposing the area to the sun until the wound has healed. Use sunscreen to protect the area from the sun after it has healed. You'll be told what to do to reduce scarring. Scarring should lessen over time. Do not take baths, swim, or use a hot tub until you're told it's OK. Ask if you  can shower. Talk with your provider about your test results or treatment options. Ask if you need to have more tests. Ask what things are safe for you to do at home. Ask when you can go back to work or school. Contact a health care provider if: You have any signs of infection, including redness, swelling, or warmth around your cut from surgery. You have a fever. Your stitches, skin glue, or tape strips loosen or come off sooner than expected. Your cut from surgery opens up. Get help right away if: You have bleeding that doesn't stop even after you put pressure or a bandage. This information is not intended to replace  advice given to you by your health care provider. Make sure you discuss any questions you have with your health care provider. Document Revised: 05/18/2023 Document Reviewed: 04/16/2023 Elsevier Patient Education  2025 [employer].

## 2023-12-11 NOTE — Assessment & Plan Note (Signed)
 She recently increase cholestyramine  to 2 packets daily.  This does seem to have helped with symptoms though she is having issues with getting medication from the pharmacy.  She will let us  know if she needs any further assistance after her next refill.

## 2023-12-15 ENCOUNTER — Ambulatory Visit: Admitting: Neurology

## 2023-12-15 ENCOUNTER — Encounter: Payer: Self-pay | Admitting: Neurology

## 2023-12-15 VITALS — BP 114/69 | HR 53 | Ht 62.0 in | Wt 136.0 lb

## 2023-12-15 DIAGNOSIS — G4733 Obstructive sleep apnea (adult) (pediatric): Secondary | ICD-10-CM | POA: Diagnosis not present

## 2023-12-15 DIAGNOSIS — R001 Bradycardia, unspecified: Secondary | ICD-10-CM

## 2023-12-15 DIAGNOSIS — G250 Essential tremor: Secondary | ICD-10-CM

## 2023-12-15 MED ORDER — PRIMIDONE 50 MG PO TABS: ORAL_TABLET | ORAL | 3 refills | Status: AC

## 2023-12-15 NOTE — Progress Notes (Signed)
 Subjective:    Patient ID: Dominique [name] is a 63 y.o. female.  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  She was last seen in a video visit a year ago and overall felt stable and doing well.  Today, 12/15/2023: I reviewed her AutoPap compliance data from 11/14/2023 through 12/13/2023, which is a total of 30 days, during which time she used her machine every night with percent use days greater than 4 hours at 100%, indicating excellent compliance with an average usage of 8 hours and 37 minutes, residual AHI at goal at 1.5/h, leak acceptable on the low side, 95th percentile of pressure at 9.7 cm with a range of 6 to 10 cm with EPR of 2.  She reports that her tremor has become worse.  She is currently on primidone  100 mg at bedtime and generic Inderal  60 mg twice daily.  She already has mild bradycardia.  She has been on primidone  since May 2024.  Her tremor is most noticeable when she tries to feed herself.  She would like to try an increased dose of primidone  and agrees that the beta-blocker should not be increased because of her bradycardia.  She is not noticing any side effects from the beta-blocker and does not feel bothered by her bradycardia.  She is up-to-date with her AutoPap supplies and uses a fullface mask with good tolerance.  She had a recent eye examination.  She had an increase in her Questran  due to diarrhea.  Previously:   12/03/2022: She reports feeling fairly stable, her midline tremor bothers her more than her hand tremor.  She is taking gabapentin  100 mg twice daily, typically 1 in the afternoon and 1 in the evening.  She is  taking propranolol  60 mg twice daily and primidone  100 mg at bedtime.  She reports good tolerance of the medication.  She is compliant with her AutoPap, I was able to review her compliance data for the past 30 days between 11/02/2022 and 12/01/2022, she used her machine every night with percent use days greater than 4 hours at 97%, average usage of 8 hours and 11 minutes, residual AHI at goal at 1.7/h, 95th percentile of pressure at 9.6, leak on the low side with the 95th percentile at 0.8 L/min.     07/15/2022: (She) reports a longstanding history of hand tremors, probably dating back to her teenage years even.  With time, her tremor has become worse.  She feels that it interferes with her ability to feed herself primarily.  Some days are worse than others, when she is particularly physically active she will notice worsening.  She tries to hydrate well with water.  She has been followed for essential tremor by Dr. Evonnie previously but a recent follow-up appointment at her office was declined for unclear reasons. She reports, that she tried to make an appointment with Dr. Evonnie but was told to come see me for it. The patient has been on propranolol , currently at 60 mg twice daily.  She has been on gabapentin  for symptomatic relief of her tremors, currently at 300 mg daily at bedtime.  This was recently increased from 100 mg daily.  She has never  been on propranolol .  She has not discussed DBS with Dr. Evonnie in the past but has seen something online about [employer].   Of note, she is on Lexapro , 20 mg daily.  She has has had a heart rate in the higher 40s and lower 50s at times. She reports that her dad who is 36 years old has a tremor.  His younger sister does not have a tremor, older sister passed away and probably did not have a tremor as far as patient's recollection.  Patient has 2 younger siblings, 1 brother, 1 sister, neither 1 has a tremor as far she knows.  She herself has 3 children, her son age 58 may have a  hand tremor as she recently asked him.  With time she has developed a head tremor.  It does not bother her very much.  She tries to limit her caffeine, generally drinks 1 cup of chai in the morning and 1 soda per day.  She drinks alcohol about 3 times a week, usually 1 drink, has not seen much in the way of improvement of her tremor with alcohol consumption.   I have seen her in my sleep clinic for her diagnosis of sleep apnea.  She was last seen in the sleep clinic on 12/04/2021, at which time she was fully compliant with her AutoPap of 6 to 10 cm with good apnea control and good results.       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  The patient is unaccompanied today.  I first met her on 07/25/2021 at the request of her primary care physician, at which time she reported a prior diagnosis of sleep apnea, she had an order CPAP machine, she was compliant with it.  She was advised to proceed with a sleep study.  She had a home sleep test on 09/17/2021 which indicated overall mild obstructive sleep apnea with an AHI of 7.4/h, O2 nadir 88% with intermittent mild to moderate snoring detected.  She was advised to continue with positive airway pressure treatment at home and I wrote for a new AutoPap machine.  Her set up date was 10/09/2021.  She has a ResMed air sense 11 AutoSet machine.   I reviewed her AutoPap compliance data from 11/03/2021 through 12/02/2021, which is a total of 30 days, during which time she used her machine every night with percent used days greater than 4 hours at 100%, indicating superb compliance with an average usage of 8 hours and 16  minutes, residual AHI at goal at 2.0/h, 95th percentile of pressure at 9.7 cm with a range of 6 to 10 cm with EPR, low leak with the 95th percentile at 0.5 L/min.  She reports doing well with her new machine has adjusted well to AutoPap therapy and is compliant with treatment and continues to benefit from treatment. She is motivated to continue at the same settings, she uses a fullface mask.   07/25/21: (She) was diagnosed with obstructive sleep apnea and placed on PAP therapy several years ago.  Prior sleep study results are not available for my review today, testing was about 8 years ago.  She has an older machine, at least 32 or 63 years old.  Machine has shown an  error message that the motor life has been exceeded.  I reviewed her office note from 05/15/2021 as well as MyChart message encounter from 07/04/2021.  Her Epworth sleepiness score is 8 out of 24, fatigue severity score is 34 out of 63.  I was able to PAP compliance data from the past 90 days, from 04/26/2021 through 07/24/2021, during which time she used her machine 89 days with percent use days greater than 4 hours at 96% currently with an average usage of 8 hours and 18 minutes, residual AHI borderline at 4.4/h, leak in the low side with the 95th percentile at 1.9 L/min, pressure of 8 cm with EPR of 3.  In the past 2 weeks she has had some decline in usage. She reports that she had acknowledgment of cough and congestion and was not always able to use her machine, other than that she has benefited from treatment in these past years, in particular with regards to improvement of her sleep consolidation, sleep quality and daytime somnolence as well as morning headaches.  She is married and lives with her husband.  She has 3 grown children.  She has been having trouble getting new supplies, she uses a fullface mask. They have 1 dog in the household.  Bedtime is generally between 11 and midnight, rise time between 8 and 9.  She drinks caffeine in the form of  soda, 1 serving per day and 1 serving of milk tea/chai.  She does not have night to night nocturia.  Her brother has sleep apnea.  She has had a fairly stable weight.     Her Past Medical History Is Significant For: Past Medical History:  Diagnosis Date   Anemia    on meds   Anxiety    on meds   Asthma    uses inhaler as needed   Blood transfusion without reported diagnosis 1992   post d/c   Cervix prolapsed into vagina    Uro-Gyne Exam 2011, 2014   Depression    on meds   GERD (gastroesophageal reflux disease)    on meds   Hearing loss    High cholesterol    diet controlled-no meds at this time (04/17/2020)   Migraines    hx of   Neuromuscular disorder (HCC)    tremors   Restless leg syndrome    taking iron to support   Sleep apnea    uses CPAP   Thyroid  disease    on meds   Tremor     Her Past Surgical History Is Significant For: Past Surgical History:  Procedure Laterality Date   BREAST BIOPSY Right    CHOLECYSTECTOMY N/A 08/14/2019   Procedure: LAPAROSCOPIC CHOLECYSTECTOMY;  Surgeon: Kinsinger, Herlene Righter, MD;  Location: [facility] OR;  Service: General;  Laterality: N/A;   COLONOSCOPY  2015   with EGD at [facility]   DILATION AND CURETTAGE OF UTERUS  02/24/1990   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     MOUTH SURGERY     3 teeth removed    ROOT CANAL     2 times    VAGINAL DELIVERY     1986   WISDOM TOOTH EXTRACTION      Her Family History Is Significant For: Family History  Problem Relation Age of Onset   Hypertension Mother    High Cholesterol Mother    Cataracts Mother    Retinal detachment Mother    Colon cancer Mother 54   Colon polyps Mother 82   Hypertension  Brother    High Cholesterol Brother    Sleep apnea Brother    Heart disease Maternal Uncle    Diabetes Paternal Aunt    Breast cancer Paternal Aunt    Colon polyps Paternal Grandmother    Diabetes Paternal Grandfather    Arthritis Paternal Grandfather    Depression Daughter    Menstrual problems  Daughter    Anxiety disorder Daughter    ADD / ADHD Son    Depression Son    Anxiety disorder Son    Breast cancer Cousin    Esophageal cancer Neg Hx    Stomach cancer Neg Hx    Rectal cancer Neg Hx    Tremor Neg Hx    Parkinson's disease Neg Hx     Her Social History Is Significant For: Social History   Socioeconomic History   Marital status: Married    Spouse name: Not on file   Number of children: 3   Years of education: Not on file   Highest education level: Professional school degree (e.g., MD, DDS, DVM, JD)  Occupational History   Occupation: retired    Comment: pediatrician  Tobacco Use   Smoking status: Never   Smokeless tobacco: Never  Vaping Use   Vaping status: Never Used  Substance and Sexual Activity   Alcohol use: Yes    Alcohol/week: 2.0 standard drinks of alcohol    Types: 2 Shots of liquor per week    Comment: occ   Drug use: No   Sexual activity: Not Currently  Other Topics Concern   Not on file  Social History Narrative   As of 11/11/16   Diet: Minimal red meat, lactose intolerance       Caffeine: Yes      Married, if yes what year: Yes, 2006      Do you live in a house, apartment, assisted living, condo, trailer, ect: House, 2 stories, 2 persons      Pets: 1 dog      Current/Past profession: Pediatrician       Exercise: A little, walking dog and elliptical          Living Will: No   DNR: No   POA/HPOA: No      Functional Status:   Do you have difficulty bathing or dressing yourself? No   Do you have difficulty preparing food or eating? No   Do you have difficulty managing your medications? No    Do you have difficulty managing your finances? No    Do you have difficulty affording your medications? No       Right handed   Lives in two story home with husband and dog   Social Drivers of Health   Financial Resource Strain: Low Risk  (12/08/2023)   Overall Financial Resource Strain (CARDIA)    Difficulty of Paying Living  Expenses: Not hard at all  Food Insecurity: No Food Insecurity (12/08/2023)   Hunger Vital Sign    Worried About Running Out of Food in the Last Year: Never true    Ran Out of Food in the Last Year: Never true  Transportation Needs: No Transportation Needs (12/08/2023)   PRAPARE - Administrator, Civil Service (Medical): No    Lack of Transportation (Non-Medical): No  Physical Activity: Insufficiently Active (12/08/2023)   Exercise Vital Sign    Days of Exercise per Week: 2 days    Minutes of Exercise per Session: 30 min  Stress: Stress Concern Present (12/08/2023)  [employer] of Occupational Health - Occupational Stress Questionnaire    Feeling of Stress: To some extent  Social Connections: Unknown (12/08/2023)   Social Connection and Isolation Panel    Frequency of Communication with Friends and Family: Twice a week    Frequency of Social Gatherings with Friends and Family: Patient declined    Attends Religious Services: More than 4 times per year    Active Member of [employer] or Organizations: Yes    Attends Engineer, structural: More than 4 times per year    Marital Status: Married    Her Allergies Are:  Allergies  Allergen Reactions   Bactrim [Sulfamethoxazole-Trimethoprim] Rash   Elemental Sulfur Rash   Sulfa Antibiotics Rash  :   Her Current Medications Are:  Outpatient Encounter Medications as of 12/15/2023  Medication Sig   albuterol  (VENTOLIN  HFA) 108 (90 Base) MCG/ACT inhaler Inhale 1 puff into the lungs every 6 (six) hours as needed for wheezing or shortness of breath.   buPROPion  (WELLBUTRIN  XL) 300 MG 24 hr tablet Take 1 tablet (300 mg total) by mouth daily.   cholestyramine  (QUESTRAN ) 4 g packet Mix and drink 1 packet by mouth twice daily.   Cyanocobalamin  (VITAMIN B12) 1000 MCG TABS    escitalopram  (LEXAPRO ) 20 MG tablet Take 1 tablet (20 mg total) by mouth daily.   ferrous sulfate 325 (65 FE) MG tablet Take 325 mg by mouth daily.    gabapentin  (NEURONTIN ) 100 MG capsule Take 1 capsule (100 mg total) by mouth 2 (two) times daily.   levothyroxine  (SYNTHROID ) 25 MCG tablet Take 1 tablet (25 mcg total) by mouth daily before breakfast.   omeprazole  (PRILOSEC) 20 MG capsule Take 20 mg by mouth daily.   primidone  (MYSOLINE ) 50 MG tablet Take 2 tablets (100 mg total) by mouth at bedtime. Please call [phone] to schedule follow-up appointment.   propranolol  (INDERAL ) 60 MG tablet Take 1 tablet (60 mg total) by mouth 2 (two) times daily.   VITAMIN D  PO Take by mouth.   No facility-administered encounter medications on file as of 12/15/2023.  :  Review of Systems:  Out of a complete 14 point review of systems, all are reviewed and negative with the exception of these symptoms as listed below:  Review of Systems  Neurological:        Pt here for tremor/cpap f/u Pt states increased tremors Pt wants to discuss dosage of primidone     ESS:8 FSS:33     Objective:  Neurological Exam  Physical Exam Physical Examination:   Vitals:   12/15/23 1054  BP: 114/69  Pulse: (!) 53    General Examination: The patient is a very pleasant 63 y.o. female in no acute distress. She appears well-developed and well-nourished and well groomed.   HEENT: Normocephalic, atraumatic, pupils are equal, round and reactive to light, tracking well-preserved, corrective eyeglasses in place.  Hearing is grossly intact, speech without dysarthria, she has a mild intermittent voice tremor and a very slight fleeting head tremor at times.  She has mild to moderate mouth dryness, otherwise stable exam, tongue protrudes centrally and palate elevates symmetrically, no carotid bruits.  Neck shows full range of motion.     Chest: Clear to auscultation without wheezing, rhonchi or crackles noted.   Heart: S1+S2+0, regular and normal without murmurs, rubs or gallops noted.  Mild bradycardia.   Abdomen: Soft, non-tender and non-distended.   Extremities:  There is no obvious edema in the left more than right ankle.  Skin: Warm and dry without trophic changes noted.    Musculoskeletal: exam reveals no obvious joint deformities.    Neurologically:  Mental status: The patient is awake, alert and oriented in all 4 spheres. Her immediate and remote memory, attention, language skills and fund of knowledge are appropriate. There is no evidence of aphasia, agnosia, apraxia or anomia. Speech is clear with normal prosody and enunciation. Thought process is linear. Mood is normal and affect is normal.  Cranial nerves II - XII are as described above under HEENT exam.  Motor exam: Normal bulk, moving all 4 extremities, no resting tremor.  She has a mild postural tremor and slight action tremor in both hands, no lower extremity tremor.   Fine motor skills and coordination: intact grossly.   Cerebellar testing: No dysmetria or intention tremor. There is no truncal or gait ataxia.   Romberg is negative.  Reflexes 2+ throughout, toes are downgoing bilaterally. Sensory exam: intact to light touch in the upper and lower extremities.  Gait, station and balance: She stands easily. No veering to one side is noted. No leaning to one side is noted. Posture is age-appropriate and stance is narrow based. Gait shows normal stride length and normal pace. No problems turning are noted.  Tandem walk is slightly challenging but doable.    Assessment and plan:  In summary, Richardine Peppers is a 63 year old female with an underlying medical history of essential tremor, anemia, asthma, obstructive sleep apnea, on AutoPap therapy, reflux disease, hyperlipidemia, migraine headaches, restless legs syndrome, thyroid  disease, anxiety and depression, and borderline overweight state, who presents for her 1 year checkup for her sleep apnea and essential tremor. She is compliant with her AutoPap.  She is up-to-date with her supplies.  Tremor is fluctuating, appears to be quite stable on  my examination but it is something that bothers her during the day a little bit more compared to last year.  We mutually agreed to try an increased dose of Mysoline  by adding 50 mg dose in the mornings.  She can start off with just half a pill which is 25 mg in the mornings for the first couple of weeks and then increase it to 50 mg in the morning and keep her 100 mg dose at bedtime.  If she finds that just adding a small dose such as 25 mg in the morning suffices for her symptom control, she can certainly stay on the lower dose as well.  She is advised to follow-up routinely in our clinic to see one of our nurse practitioners in about a year, we can offer her a video visit as well if she prefers.  I answered all her questions today and she was in agreement with our plan.  I adjusted her Mysoline  prescription for her.  She gets all of her other prescriptions from her PCP or other providers.  I would not recommend increasing her beta-blocker due to bradycardia, and she agrees.   I spent 30 minutes in total face-to-face time and in reviewing records during pre-charting, more than 50% of which was spent in counseling and coordination of care, reviewing test results, reviewing medications and treatment regimen and/or in discussing or reviewing the diagnosis of OSA on PAP therapy, essential tremor, the prognosis and treatment options. Pertinent laboratory and imaging test results that were available during this visit with the patient were reviewed by me and considered in my medical decision making (see chart for details).

## 2023-12-15 NOTE — Patient Instructions (Signed)
 Please continue using your autoPAP regularly. While your insurance requires that you use PAP at least 4 hours each night on 70% of the nights, I recommend, that you not skip any nights and use it throughout the night if you can. Getting used to PAP and staying with the treatment long term does take time and patience and discipline. Untreated obstructive sleep apnea when it is moderate to severe can have an adverse impact on cardiovascular health and raise her risk for heart disease, arrhythmias, hypertension, congestive heart failure, stroke and diabetes. Untreated obstructive sleep apnea causes sleep disruption, nonrestorative sleep, and sleep deprivation. This can have an impact on your day to day functioning and cause daytime sleepiness and impairment of cognitive function, memory loss, mood disturbance, and problems focussing. Using PAP regularly can improve these symptoms.   As discussed, we will increase your primidone  to 50 mg in the morning and 100 mg at bedtime.  You can increase this slowly by adding just 25 mg in the morning which is half a pill for the next couple of weeks and then go up to 50 mg in the morning and continue with your 100 mg dose at bedtime.  If you feel that just adding half a pill in the morning is good enough for you, you can obviously stay on the lower dose for the morning as well.  Follow-up in about a year to see one of our nurse practitioners.

## 2023-12-17 ENCOUNTER — Ambulatory Visit: Payer: Self-pay | Admitting: Family Medicine

## 2023-12-17 DIAGNOSIS — D239 Other benign neoplasm of skin, unspecified: Secondary | ICD-10-CM

## 2023-12-17 LAB — DERMATOLOGY PATHOLOGY

## 2023-12-17 NOTE — Progress Notes (Signed)
 Her biopsy showed a dysplastic nevus.  This is not a melanoma and they generally do not transform into one however some dermatologists recommend complete excision, though there is no consensus.   It is also generally recommended to have at least a yearly skin examination with a dermatoscope. Recommend we refer her to dermatology.  Please place referral if she is agreeable.

## 2023-12-22 NOTE — Telephone Encounter (Signed)
 Spoke with patient, notified Dermatology appt are running on that time  Suggest to give them a call for cancellation

## 2023-12-28 ENCOUNTER — Telehealth: Payer: Self-pay | Admitting: *Deleted

## 2023-12-28 DIAGNOSIS — F325 Major depressive disorder, single episode, in full remission: Secondary | ICD-10-CM

## 2023-12-28 NOTE — Telephone Encounter (Signed)
 Copied from CRM [id number]. Topic: General - Other >> Dec 24, 2023 11:15 AM Victoria A wrote: Reason for CRM: Mliss from Psychiatry office call and asked for information for patient because referral did not have any contact information please call at [phone] Referral Medication Management

## 2024-02-23 ENCOUNTER — Encounter: Payer: Self-pay | Admitting: Family Medicine

## 2024-02-23 NOTE — Addendum Note (Signed)
 Addended by: THURMON ARLAND PARAS on: 02/23/2024 03:37 PM   Modules accepted: Orders

## 2024-02-23 NOTE — Telephone Encounter (Signed)
 Referral put in Epic for Psychiatry at [facility].

## 2024-03-09 ENCOUNTER — Encounter: Payer: Self-pay | Admitting: Dermatology

## 2024-03-09 ENCOUNTER — Ambulatory Visit: Admitting: Dermatology

## 2024-03-09 VITALS — BP 117/74 | HR 53 | Temp 98.2°F

## 2024-03-09 DIAGNOSIS — D239 Other benign neoplasm of skin, unspecified: Secondary | ICD-10-CM

## 2024-03-09 DIAGNOSIS — D224 Melanocytic nevi of scalp and neck: Secondary | ICD-10-CM | POA: Diagnosis not present

## 2024-03-09 DIAGNOSIS — Z86018 Personal history of other benign neoplasm: Secondary | ICD-10-CM | POA: Diagnosis not present

## 2024-03-09 HISTORY — DX: Other benign neoplasm of skin, unspecified: D23.9

## 2024-03-09 NOTE — Patient Instructions (Addendum)
 Staples Wound Care Instructions Keep the white pressure bandage on and dry for 48 hours (72 hours if you take blood thinners). After removing the bandage: Gently wash the area with soap and water twice a day Apply a thin layer of Vaseline, Aquaphor, or petroleum jelly Do not remove staples on your own. Your provider will instruct you when to return for staple removal IN THE EVENT OF AN AFTER HOURS EMERGENCY (PAIN, BLEEDING, INFECTION), PLEASE CALL OUR OFFICE AND YOU BE DIRECTED TO THE ON CALL STAFF. MYCHART MESSAGES SENT AFTER HOURS WILL BE ADDRESSED ON THE NEXT BUSINESS DAY  What to Watch For After Surgery Bleeding Some light bleeding is normal in the first 24 hours. To lower bleeding risk: Do not lift more than 10 pounds for 1 week If surgery was on your face, head, or neck, do not bend or stoop for 72 hours If surgery was on an arm or leg, keep it raised as much as possible (Limit standing and walking if it was on the leg) If bleeding happens: Press firmly on the area for 30 minutes without looking If bleeding continues, press again for another 30 minutes Call the office if bleeding does not stop  Swelling and Bruising Swelling and bruising are normal. Use an ice pack for 15 minutes each hour during the first 1-2 days Wrap ice in a towel to keep bandages dry Keep the area raised when possible Use extra pillows if surgery was on the head or neck Raise arms or legs near heart level if surgery was there  Infection (Uncommon) Call the office if you notice: Increasing pain Redness or swelling Yellow drainage (pus) Symptoms starting several days after surgery  Pain Control Some pain is normal. Rest, ice, and elevation help You may take Tylenol  (acetaminophen ) and Ibuprofen (Advil/Motrin) Do not take aspirin for pain If you already take daily aspirin, continue it, but do not add extra Safe option (if allowed): Take 2 ibuprofen (200 mg each) 3 hours later take 2 Tylenol  (325  mg each) Keep alternating every 3 hours as needed If you cannot take ibuprofen: Take Tylenol  650 mg every 4-6 hours EXAMPLE: Time Medicine Dose  6:00 AM Ibuprofen 400 mg (2 tablets of 200 mg)  9:00 AM Tylenol  650 mg (2 tablets of 325 mg)  12:00 PM Ibuprofen 400 mg  3:00 PM Tylenol  650 mg  6:00 PM Ibuprofen 400 mg  9:00 PM Tylenol  650 mg   Healing and Scar Questions When will my scar fade? It takes about 1 year for the scar to mature. Redness usually fades over time. Can my wound open? Yes, the area is weak for the first 6 months. Avoid pulling or stretching it. When will feeling return? Numbness or tingling can last 1-2 years. Some numbness may be permanent. Nose stuffiness If surgery was on your nose, stuffiness can last several months and will slowly improve. When can I stop wound care? You may stop once the skin is fully healed with no open areas. Makeup and sunscreen You may use makeup and sunscreen once: Stitches are removed, and The wound is fully healed Use sunscreen daily on healed skin.  Lumps, Puffiness, and Massage Small lumps under the skin are normal and will go away A small pimple along the scar can happen Use warm compresses Call if it does not improve Puffy scars can sometimes be treated -- call the office if concerned After 1 month, gently massage the scar 2-3 times a day  Scar Products No Scar  products should be applied until 1 month after your surgery Scar creams are optional Plain Vaseline works very well and is safe Stop any product that causes irritation  Future Skin Checks You may develop skin cancer again. See your dermatologist every 6-12 months Regular skin checks help find problems early  Important Information  Due to recent changes in healthcare laws, you may see results of your pathology and/or laboratory studies on MyChart before the doctors have had a chance to review them. We understand that in some cases there may be results that  are confusing or concerning to you. Please understand that not all results are received at the same time and often the doctors may need to interpret multiple results in order to provide you with the best plan of care or course of treatment. Therefore, we ask that you please give us  2 business days to thoroughly review all your results before contacting the office for clarification. Should we see a critical lab result, you will be contacted sooner.   If You Need Anything After Your Visit  If you have any questions or concerns for your doctor, please call our main line at [phone] If no one answers, please leave a voicemail as directed and we will return your call as soon as possible. Messages left after 4 pm will be answered the following business day.   You may also send us  a message via MyChart. We typically respond to MyChart messages within 1-2 business days.  For prescription refills, please ask your pharmacy to contact our office. Our fax number is [phone].  If you have an urgent issue when the clinic is closed that cannot wait until the next business day, you can page your doctor at the number below.    Please note that while we do our best to be available for urgent issues outside of office hours, we are not available 24/7.   If you have an urgent issue and are unable to reach us , you may choose to seek medical care at your doctor's office, retail clinic, urgent care center, or emergency room.  If you have a medical emergency, please immediately call 911 or go to the emergency department. In the event of inclement weather, please call our main line at [phone] for an update on the status of any delays or closures.  Dermatology Medication Tips: Please keep the boxes that topical medications come in in order to help keep track of the instructions about where and how to use these. Pharmacies typically print the medication instructions only on the boxes and not directly on the  medication tubes.   If your medication is too expensive, please contact our office at [phone] or send us  a message through MyChart.   We are unable to tell what your co-pay for medications will be in advance as this is different depending on your insurance coverage. However, we may be able to find a substitute medication at lower cost or fill out paperwork to get insurance to cover a needed medication.   If a prior authorization is required to get your medication covered by your insurance company, please allow us  1-2 business days to complete this process.  Drug prices often vary depending on where the prescription is filled and some pharmacies may offer cheaper prices.  The website www.goodrx.com contains coupons for medications through different pharmacies. The prices here do not account for what the cost may be with help from insurance (it may be cheaper with your insurance), but the  website can give you the price if you did not use any insurance.  - You can print the associated coupon and take it with your prescription to the pharmacy.  - You may also stop by our office during regular business hours and pick up a GoodRx coupon card.  - If you need your prescription sent electronically to a different pharmacy, notify our office through [facility] or by phone at [phone]

## 2024-03-09 NOTE — Progress Notes (Signed)
 "  Follow-Up Visit   Subjective  Dominique [name] is a 64 y.o. 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 Biopsy was performed on 11/25/2023 by Worth Kitty, MD.  The following portions of the chart were reviewed this encounter and updated as appropriate: medications, allergies, medical history  Review of Systems:  No other skin or systemic complaints except as noted in HPI or Assessment and Plan.  Pt reports lesion has been present for 4 months and denies previous treatment. Pt denies pain and itchiness.   Pt denies personal history of skin cancer. Pt reports family history of skin cancer.   Objective  Well appearing patient in no apparent distress; mood and affect are within normal limits.  A focused examination was performed of the following areas: Upper posterior scalp Relevant physical exam findings are noted in the Assessment and Plan.   Mid Parietal Scalp Irregularly shaped brown macule with adjacent scar   Assessment & Plan   DYSPLASTIC NEVUS Mid Parietal Scalp - Skin excision - Mid Parietal Scalp  Excision method:  elliptical Lesion length (cm):  1.1 Lesion width (cm):  0.1 Margin per side (cm):  0.5 Total excision diameter (cm):  2.1 Informed consent: discussed and consent obtained   Timeout: patient name, date of birth, surgical site, and procedure verified   Procedure prep:  Patient was prepped and draped in usual sterile fashion Prep type:  Chlorhexidine  Anesthesia: the lesion was anesthetized in a standard fashion   Anesthetic:  1% lidocaine  w/ epinephrine 1-100,000 buffered w/ 8.4% NaHCO3 Instrument used: #15 blade   Hemostasis achieved with: suture, pressure and electrodesiccation   Outcome: patient tolerated procedure well with no complications   Post-procedure details: sterile dressing applied and wound care instructions given   Dressing type: pressure dressing  and bandage    - Skin repair - Mid Parietal Scalp Complexity:  Complex Final length (cm):  3.5 Informed consent: discussed and consent obtained   Timeout: patient name, date of birth, surgical site, and procedure verified   Procedure prep:  Patient was prepped and draped in usual sterile fashion Prep type:  Chlorhexidine  Anesthesia: the lesion was anesthetized in a standard fashion   Anesthetic:  1% lidocaine  w/ epinephrine 1-100,000 buffered w/ 8.4% NaHCO3 (6cc) Reason for type of repair: reduce the risk of dehiscence, infection, and necrosis, allow closure of the large defect and preserve normal anatomy   Undermining: area extensively undermined   Subcutaneous layers (deep stitches):  Suture size:  3-0 Suture type: Vicryl (polyglactin 910)   Stitches:  Buried vertical mattress Fine/surface layer approximation (top stitches):  Suture type comment:  Staples Suture removal (days):  10 Hemostasis achieved with: suture, pressure and electrodesiccation Outcome: patient tolerated procedure well with no complications   Post-procedure details: sterile dressing applied and wound care instructions given   Dressing type: petrolatum, pressure dressing and bandage (Ace Wrap)    Specimen 1 - Surgical pathology Differential Diagnosis: 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 Wound edges were undermined extensively along at least one entire edge and at a distance equal to or greater than the width of the defect (see wound defect size above) in order to achieve closure and decrease wound tension and anatomic distortion. Redundant tissue repair including standing cone removal was performed. Hemostasis was achieved with electrocautery. Subcutaneous and epidermal  tissues were approximated with the above sutures. The surgical site was then lightly scrubbed with sterile,  saline-soaked gauze. The area was then bandaged using Vaseline ointment, non-adherent gauze, gauze pads, and tape to provide an adequate pressure dressing. The patient tolerated the procedure well, was given detailed written and verbal wound care instructions, and was discharged in good condition.   The patient will follow-up: 10 days for suture removal.  HISTORY OF DYSPLASTIC NEVUS No evidence of recurrence today Recommend regular full body skin exams Recommend daily broad spectrum sunscreen SPF 30+ to sun-exposed areas, reapply every 2 hours as needed.  Call if any new or changing lesions are noted between office visits  Return in about 10 days (around 03/19/2024) for Staple Removal on Upper Posterior Scalp.  LILLETTE Virgle Boards, Mohs/Dermatology Tech am acting as a neurosurgeon for RUFUS CHRISTELLA HOLY, MD.    Documentation: I have reviewed the above documentation for accuracy and completeness, and I agree with the above.  RUFUS CHRISTELLA HOLY, MD  "

## 2024-03-10 LAB — SURGICAL PATHOLOGY

## 2024-03-11 ENCOUNTER — Ambulatory Visit: Payer: Self-pay | Admitting: Dermatology

## 2024-03-17 ENCOUNTER — Encounter: Payer: Self-pay | Admitting: Dermatology

## 2024-03-17 ENCOUNTER — Ambulatory Visit (INDEPENDENT_AMBULATORY_CARE_PROVIDER_SITE_OTHER): Admitting: Dermatology

## 2024-03-17 DIAGNOSIS — Z86018 Personal history of other benign neoplasm: Secondary | ICD-10-CM

## 2024-03-17 DIAGNOSIS — L905 Scar conditions and fibrosis of skin: Secondary | ICD-10-CM

## 2024-03-17 DIAGNOSIS — D239 Other benign neoplasm of skin, unspecified: Secondary | ICD-10-CM

## 2024-03-17 NOTE — Progress Notes (Signed)
" ° °  Follow Up Visit   Subjective  Dominique [name] is a 64 y.o. female who presents for the following: follow up from Excision of DN severe on scalp.  The patient presents for follow up from Excision surgery for a Dysplastic Nevus on the mid parietal scalp , treated on 03/09/2024, repaired with a complex linear closure. The patient has been bandaging the wound as directed. The endorse the following concerns: none.   The following portions of the chart were reviewed this encounter and updated as appropriate: medications, allergies, medical history  Review of Systems:  No other skin or systemic complaints except as noted in HPI or Assessment and Plan.  Objective  Well appearing patient in no apparent distress; mood and affect are within normal limits.  A focal examination was performed including the scalp. All findings within normal limits unless otherwise noted below.  Healing wound with mild erythema  Relevant physical exam findings are noted in the Assessment and Plan.         Assessment & Plan    Healing Wound s/p Excision for a Dysplastic Nevus on the mid parietal scalp , treated on 03/09/2024, repaired with a complex linear closure. - Reassured that wound is healing well - No evidence of infection - No swelling, induration, purulence, dehiscence, or tenderness out of proportion to the clinical exam, see photo above - Discussed that scars take up to 12 months to mature from the date of surgery - Recommend SPF 30+ to scar daily to prevent purple color from UV exposure during scar maturation process - Discussed that erythema and raised appearance of scar will fade over the next 4-6 months - OK to start scar massage at 4-6 weeks post-op - Can consider silicone based products for scar healing starting at 6 weeks post-op - Ok to discontinue ointment.   History of Dysplastic Nevi - No evidence of recurrence today - Recommend regular full body skin exams - Recommend daily broad  spectrum sunscreen SPF 30+ to sun-exposed areas, reapply every 2 hours as needed.  - Call if any new or changing lesions are noted between office visits    Return if symptoms worsen or fail to improve.  Dominique Rollene Gobble, RN, am acting as scribe for RUFUS CHRISTELLA HOLY, MD .   Documentation: I have reviewed the above documentation for accuracy and completeness, and I agree with the above.  RUFUS CHRISTELLA HOLY, MD "

## 2024-03-17 NOTE — Patient Instructions (Signed)

## 2024-03-21 ENCOUNTER — Ambulatory Visit: Admitting: Dermatology

## 2024-12-20 ENCOUNTER — Ambulatory Visit: Admitting: Adult Health
